# Patient Record
Sex: Male | Born: 1955 | Race: White | Hispanic: No | Marital: Married | Smoking: Current every day smoker
Health system: Southern US, Community
[De-identification: ages and names within clinical notes are randomized; demographics above are authoritative.]

## PROBLEM LIST (undated history)

## (undated) DIAGNOSIS — G35 Multiple sclerosis: Secondary | ICD-10-CM

## (undated) DIAGNOSIS — J449 Chronic obstructive pulmonary disease, unspecified: Secondary | ICD-10-CM

## (undated) DIAGNOSIS — G1221 Amyotrophic lateral sclerosis: Secondary | ICD-10-CM

## (undated) DIAGNOSIS — E785 Hyperlipidemia, unspecified: Secondary | ICD-10-CM

## (undated) DIAGNOSIS — Z720 Tobacco use: Secondary | ICD-10-CM

## (undated) DIAGNOSIS — R1013 Epigastric pain: Secondary | ICD-10-CM

## (undated) DIAGNOSIS — M5412 Radiculopathy, cervical region: Secondary | ICD-10-CM

## (undated) HISTORY — PX: CHOLECYSTECTOMY: SHX55

## (undated) HISTORY — DX: Hyperlipidemia, unspecified: E78.5

## (undated) HISTORY — DX: Epigastric pain: R10.13

## (undated) HISTORY — DX: Radiculopathy, cervical region: M54.12

## (undated) HISTORY — DX: Tobacco use: Z72.0

## (undated) HISTORY — DX: Multiple sclerosis: G35

---

## 2004-10-06 ENCOUNTER — Ambulatory Visit: Payer: Self-pay | Admitting: Internal Medicine

## 2005-03-30 ENCOUNTER — Ambulatory Visit: Payer: Self-pay | Admitting: Unknown Physician Specialty

## 2005-04-15 ENCOUNTER — Ambulatory Visit: Payer: Self-pay | Admitting: Unknown Physician Specialty

## 2006-01-01 ENCOUNTER — Other Ambulatory Visit: Payer: Self-pay

## 2006-01-01 ENCOUNTER — Emergency Department: Payer: Self-pay | Admitting: Emergency Medicine

## 2006-02-24 ENCOUNTER — Ambulatory Visit: Payer: Self-pay | Admitting: Internal Medicine

## 2006-11-03 ENCOUNTER — Other Ambulatory Visit: Payer: Self-pay

## 2006-11-04 ENCOUNTER — Inpatient Hospital Stay: Payer: Self-pay | Admitting: Internal Medicine

## 2007-08-04 ENCOUNTER — Ambulatory Visit: Payer: Self-pay | Admitting: Internal Medicine

## 2007-11-02 ENCOUNTER — Ambulatory Visit: Payer: Self-pay | Admitting: Pain Medicine

## 2008-07-12 ENCOUNTER — Emergency Department: Payer: Self-pay | Admitting: Internal Medicine

## 2008-08-21 ENCOUNTER — Emergency Department: Payer: Self-pay | Admitting: Emergency Medicine

## 2010-02-26 ENCOUNTER — Ambulatory Visit: Payer: Self-pay | Admitting: Family Medicine

## 2010-08-18 ENCOUNTER — Emergency Department: Payer: Self-pay | Admitting: Emergency Medicine

## 2012-07-30 ENCOUNTER — Emergency Department: Payer: Self-pay | Admitting: Emergency Medicine

## 2012-08-04 ENCOUNTER — Encounter: Payer: Self-pay | Admitting: Nurse Practitioner

## 2012-08-04 ENCOUNTER — Encounter: Payer: Self-pay | Admitting: Cardiothoracic Surgery

## 2012-08-18 ENCOUNTER — Ambulatory Visit: Payer: Self-pay | Admitting: Vascular Surgery

## 2012-09-08 ENCOUNTER — Ambulatory Visit: Payer: Self-pay | Admitting: Cardiology

## 2013-07-27 ENCOUNTER — Ambulatory Visit: Payer: Medicare Other | Attending: Family Medicine | Admitting: Physical Therapy

## 2013-07-27 DIAGNOSIS — M6281 Muscle weakness (generalized): Secondary | ICD-10-CM | POA: Insufficient documentation

## 2013-07-27 DIAGNOSIS — IMO0001 Reserved for inherently not codable concepts without codable children: Secondary | ICD-10-CM | POA: Insufficient documentation

## 2013-07-27 DIAGNOSIS — G35 Multiple sclerosis: Secondary | ICD-10-CM | POA: Insufficient documentation

## 2014-06-01 ENCOUNTER — Emergency Department: Payer: Self-pay | Admitting: Emergency Medicine

## 2014-06-01 LAB — URINALYSIS, COMPLETE
BACTERIA: NONE SEEN
Bilirubin,UR: NEGATIVE
Blood: NEGATIVE
Glucose,UR: NEGATIVE mg/dL (ref 0–75)
Ketone: NEGATIVE
Leukocyte Esterase: NEGATIVE
Nitrite: NEGATIVE
Ph: 6 (ref 4.5–8.0)
Protein: NEGATIVE
SQUAMOUS EPITHELIAL: NONE SEEN
Specific Gravity: 1.006 (ref 1.003–1.030)
WBC UR: NONE SEEN /HPF (ref 0–5)

## 2014-06-01 LAB — COMPREHENSIVE METABOLIC PANEL
ALBUMIN: 4.1 g/dL (ref 3.4–5.0)
ANION GAP: 0 — AB (ref 7–16)
AST: 22 U/L (ref 15–37)
Alkaline Phosphatase: 180 U/L — ABNORMAL HIGH
BILIRUBIN TOTAL: 0.4 mg/dL (ref 0.2–1.0)
BUN: 8 mg/dL (ref 7–18)
CALCIUM: 8.9 mg/dL (ref 8.5–10.1)
Chloride: 106 mmol/L (ref 98–107)
Co2: 25 mmol/L (ref 21–32)
Creatinine: 0.86 mg/dL (ref 0.60–1.30)
EGFR (Non-African Amer.): >60
Glucose: 96 mg/dL (ref 65–99)
Osmolality: 261 (ref 275–301)
POTASSIUM: 3.7 mmol/L (ref 3.5–5.1)
SGPT (ALT): 30 U/L (ref 12–78)
Sodium: 131 mmol/L — ABNORMAL LOW (ref 136–145)
Total Protein: 7.7 g/dL (ref 6.4–8.2)

## 2014-06-01 LAB — CBC
HCT: 50.7 % (ref 40.0–52.0)
HGB: 17.1 g/dL (ref 13.0–18.0)
MCH: 31.7 pg (ref 26.0–34.0)
MCHC: 33.8 g/dL (ref 32.0–36.0)
MCV: 94 fL (ref 80–100)
Platelet: 233 10*3/uL (ref 150–440)
RBC: 5.41 10*6/uL (ref 4.40–5.90)
RDW: 13.3 % (ref 11.5–14.5)
WBC: 17.7 10*3/uL — ABNORMAL HIGH (ref 3.8–10.6)

## 2014-07-21 ENCOUNTER — Emergency Department: Payer: Self-pay | Admitting: Emergency Medicine

## 2014-07-21 LAB — URINALYSIS, COMPLETE
Bacteria: NONE SEEN
Bilirubin,UR: NEGATIVE
Blood: NEGATIVE
GLUCOSE, UR: NEGATIVE mg/dL (ref 0–75)
KETONE: NEGATIVE
LEUKOCYTE ESTERASE: NEGATIVE
Nitrite: NEGATIVE
PROTEIN: NEGATIVE
Ph: 6 (ref 4.5–8.0)
RBC, UR: NONE SEEN /HPF (ref 0–5)
SPECIFIC GRAVITY: 1.011 (ref 1.003–1.030)
Squamous Epithelial: NONE SEEN
WBC UR: <1 /HPF (ref 0–5)

## 2014-07-21 LAB — CBC
HCT: 52.4 % — ABNORMAL HIGH (ref 40.0–52.0)
HGB: 17.4 g/dL (ref 13.0–18.0)
MCH: 31.3 pg (ref 26.0–34.0)
MCHC: 33.2 g/dL (ref 32.0–36.0)
MCV: 94 fL (ref 80–100)
Platelet: 286 10*3/uL (ref 150–440)
RBC: 5.56 10*6/uL (ref 4.40–5.90)
RDW: 13.6 % (ref 11.5–14.5)
WBC: 13.6 10*3/uL — ABNORMAL HIGH (ref 3.8–10.6)

## 2014-07-21 LAB — BASIC METABOLIC PANEL
Anion Gap: 9 (ref 7–16)
BUN: 8 mg/dL (ref 7–18)
CALCIUM: 8.9 mg/dL (ref 8.5–10.1)
CHLORIDE: 103 mmol/L (ref 98–107)
CREATININE: 0.87 mg/dL (ref 0.60–1.30)
Co2: 25 mmol/L (ref 21–32)
Glucose: 86 mg/dL (ref 65–99)
OSMOLALITY: 271 (ref 275–301)
Potassium: 4 mmol/L (ref 3.5–5.1)
Sodium: 137 mmol/L (ref 136–145)

## 2014-11-07 ENCOUNTER — Emergency Department: Payer: Self-pay | Admitting: Emergency Medicine

## 2014-11-07 LAB — URINALYSIS, COMPLETE
BACTERIA: NONE SEEN
BILIRUBIN, UR: NEGATIVE
BLOOD: NEGATIVE
Glucose,UR: NEGATIVE mg/dL (ref 0–75)
KETONE: NEGATIVE
Leukocyte Esterase: NEGATIVE
NITRITE: NEGATIVE
Ph: 6 (ref 4.5–8.0)
Protein: NEGATIVE
RBC,UR: 1 /HPF (ref 0–5)
SPECIFIC GRAVITY: 1.009 (ref 1.003–1.030)
SQUAMOUS EPITHELIAL: NONE SEEN
WBC UR: <1 /HPF (ref 0–5)

## 2015-12-09 ENCOUNTER — Encounter: Payer: Self-pay | Admitting: Emergency Medicine

## 2015-12-09 ENCOUNTER — Emergency Department
Admission: EM | Admit: 2015-12-09 | Discharge: 2015-12-09 | Disposition: A | Discharge status: Home / Self Care | Payer: Medicare Other | Attending: Emergency Medicine | Admitting: Emergency Medicine

## 2015-12-09 DIAGNOSIS — R1030 Lower abdominal pain, unspecified: Secondary | ICD-10-CM | POA: Diagnosis not present

## 2015-12-09 DIAGNOSIS — Z880 Allergy status to penicillin: Secondary | ICD-10-CM | POA: Insufficient documentation

## 2015-12-09 DIAGNOSIS — F17200 Nicotine dependence, unspecified, uncomplicated: Secondary | ICD-10-CM | POA: Insufficient documentation

## 2015-12-09 DIAGNOSIS — R339 Retention of urine, unspecified: Secondary | ICD-10-CM | POA: Insufficient documentation

## 2015-12-09 HISTORY — DX: Chronic obstructive pulmonary disease, unspecified: J44.9

## 2015-12-09 HISTORY — DX: Amyotrophic lateral sclerosis: G12.21

## 2015-12-09 LAB — URINALYSIS COMPLETE WITH MICROSCOPIC (ARMC ONLY)
BILIRUBIN URINE: NEGATIVE
Bacteria, UA: NONE SEEN
GLUCOSE, UA: NEGATIVE mg/dL
KETONES UR: NEGATIVE mg/dL
Leukocytes, UA: NEGATIVE
NITRITE: NEGATIVE
Protein, ur: 30 mg/dL — AB
SPECIFIC GRAVITY, URINE: 1.01 (ref 1.005–1.030)
Squamous Epithelial / LPF: NONE SEEN
pH: 6 (ref 5.0–8.0)

## 2015-12-09 MED ORDER — LIDOCAINE HCL 2 % EX GEL
1.0000 "application " | Freq: Once | CUTANEOUS | Status: AC
  Administered 2015-12-09: 1 via URETHRAL

## 2015-12-09 MED ORDER — LIDOCAINE HCL 2 % EX GEL
CUTANEOUS | Status: AC
  Administered 2015-12-09: 1 via URETHRAL
  Filled 2015-12-09: qty 10

## 2015-12-09 NOTE — ED Notes (Signed)
Pt voices relief s/p cath. Clear yellow urine. Needs to cath 1-2 times year.

## 2015-12-09 NOTE — ED Notes (Signed)
Pt with 1000cc urine output from indwelling foley

## 2015-12-09 NOTE — ED Provider Notes (Signed)
[facility] Emergency Department Provider Note  ____________________________________________    I have reviewed the triage vital signs and the nursing notes.   HISTORY  Chief Complaint Urinary Retention    HPI Shaun Turner is a 60 y.o. male with a history of AMS who self caths routinely but today was unable to get urine out and his home nurse adjusted that maybe he has urinary tract infection. He reports last and that he urinated was approximately 2-3 AM this morning. He denies fevers or chills. He does have abdominal fullness and discomfort in his lower abdomen     Past Medical History  Diagnosis Date  . 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 COPD (chronic obstructive pulmonary disease) (HCC)     There are no active problems to display for this patient.   Past Surgical History  Procedure Laterality Date  . Cholecystectomy      No current outpatient prescriptions on file.  Allergies Penicillins  No family history on file.  Social History Social History  Substance Use Topics  . Smoking status: Current Every Day Smoker  . Smokeless tobacco: None  . Alcohol Use: No    Review of Systems  Constitutional: Negative for fever. Eyes: Negative for visual changes. ENT: Negative for sore throat Cardiovascular: Negative for chest pain. Respiratory: Negative for shortness of breath. Gastrointestinal: Abdominal discomfort Genitourinary: Self catheter, urinary retention Musculoskeletal: Negative for back pain. Skin: Negative for rash. Neurological: Negative for headaches Psychiatric:*No anxiety    ____________________________________________   PHYSICAL EXAM:  VITAL SIGNS: ED Triage Vitals  Enc Vitals Group     BP 12/09/15 1313 119/107 mmHg     Pulse Rate 12/09/15 1313 100     Resp 12/09/15 1313 20     Temp 12/09/15 1313 98.2 F (36.8 C)     Temp Source 12/09/15 1313 Oral     SpO2 12/09/15 1313 95 %      Weight 12/09/15 1313 158 lb (71.668 kg)     Height 12/09/15 1313 5\' 11"  (1.803 m)     Head Cir --      Peak Flow --      Pain Score 12/09/15 1314 10     Pain Loc --      Pain Edu? --      Excl. in GC? --      Constitutional: Alert and oriented. Well appearing and in no distress. Eyes: Conjunctivae are normal.  ENT   Head: Normocephalic and atraumatic.   Mouth/Throat: Mucous membranes are moist. Cardiovascular: Normal rate, regular rhythm. Normal and symmetric distal pulses are present in all extremities. Respiratory: Normal respiratory effort without tachypnea nor retractions. Breath sounds are clear and equal bilaterally.  Gastrointestinal: Soft and non-tender in all quadrants. No distention. There is no CVA tenderness. Exam performed after Foley was placed by RN Genitourinary: No erythema or swelling . Neurologic:  Normal speech and language.  Skin:  Skin is warm, dry and intact. No rash noted. Psychiatric: Mood and affect are normal. Patient exhibits appropriate insight and judgment.  ____________________________________________    LABS (pertinent positives/negatives)  Labs Reviewed  URINALYSIS COMPLETEWITH MICROSCOPIC ([facility] ONLY)    ____________________________________________   EKG  None  ____________________________________________    RADIOLOGY I have personally reviewed any xrays that were ordered on this patient: None  ____________________________________________   PROCEDURES  Procedure(s) performed: none  Critical Care performed: none  ____________________________________________   INITIAL IMPRESSION / ASSESSMENT AND PLAN / ED COURSE  Pertinent labs &  imaging results that were available during my care of the patient were reviewed by me and considered in my medical decision making (see chart for details).  Experienced significant relief after Foley placement by RN. We will send urinalysis but otherwise no further workup  needed   Urinalysis unremarkable. Patient will follow-up with his PCP or his urologist for removal, whomever he can see first ____________________________________________   FINAL CLINICAL IMPRESSION(S) / ED DIAGNOSES  Final diagnoses:  Urinary retention     Jene Every, MD 12/09/15 1426

## 2015-12-09 NOTE — ED Notes (Signed)
Self-caths and unable to get urine out since around 1:00am.  Painful in abd.  History of MS.  Concerned has a UTI

## 2015-12-09 NOTE — Discharge Instructions (Signed)
Acute Urinary Retention, Male °Acute urinary retention is the temporary inability to urinate. °This is a common problem in older men. As men age their prostates become larger and block the flow of urine from the bladder. This is usually a problem that has come on gradually.  °HOME CARE INSTRUCTIONS °If you are sent home with a Foley catheter and a drainage system, you will need to discuss the best course of action with your health care provider. While the catheter is in, maintain a good intake of fluids. Keep the drainage bag emptied and lower than your catheter. This is so that contaminated urine will not flow back into your bladder, which could lead to a urinary tract infection. °There are two main types of drainage bags. One is a large bag that usually is used at night. It has a good capacity that will allow you to sleep through the night without having to empty it. The second type is called a leg bag. It has a smaller capacity, so it needs to be emptied more frequently. However, the main advantage is that it can be attached by a leg strap and can go underneath your clothing, allowing you the freedom to move about or leave your home. °Only take over-the-counter or prescription medicines for pain, discomfort, or fever as directed by your health care provider.  °SEEK MEDICAL CARE IF: °· You develop a low-grade fever. °· You experience spasms or leakage of urine with the spasms. °SEEK IMMEDIATE MEDICAL CARE IF:  °· You develop chills or fever. °· Your catheter stops draining urine. °· Your catheter falls out. °· You start to develop increased bleeding that does not respond to rest and increased fluid intake. °MAKE SURE YOU: °· Understand these instructions. °· Will watch your condition. °· Will get help right away if you are not doing well or get worse. °  °This information is not intended to replace advice given to you by your health care provider. Make sure you discuss any questions you have with your health care  provider. °  °Document Released: 02/08/2001 Document Revised: 03/19/2015 Document Reviewed: 04/13/2013 °Elsevier Interactive Patient Education ©2016 [employer] ° °

## 2015-12-19 ENCOUNTER — Encounter: Payer: Self-pay | Admitting: Emergency Medicine

## 2015-12-19 ENCOUNTER — Emergency Department
Admission: EM | Admit: 2015-12-19 | Discharge: 2015-12-19 | Disposition: A | Discharge status: Home / Self Care | Payer: Medicare Other | Attending: Emergency Medicine | Admitting: Emergency Medicine

## 2015-12-19 DIAGNOSIS — Z880 Allergy status to penicillin: Secondary | ICD-10-CM | POA: Diagnosis not present

## 2015-12-19 DIAGNOSIS — R34 Anuria and oliguria: Secondary | ICD-10-CM | POA: Diagnosis not present

## 2015-12-19 DIAGNOSIS — F17200 Nicotine dependence, unspecified, uncomplicated: Secondary | ICD-10-CM | POA: Insufficient documentation

## 2015-12-19 DIAGNOSIS — Z466 Encounter for fitting and adjustment of urinary device: Secondary | ICD-10-CM | POA: Diagnosis not present

## 2015-12-19 DIAGNOSIS — R339 Retention of urine, unspecified: Secondary | ICD-10-CM | POA: Diagnosis not present

## 2015-12-19 LAB — URINALYSIS COMPLETE WITH MICROSCOPIC (ARMC ONLY)
Bilirubin Urine: NEGATIVE
GLUCOSE, UA: NEGATIVE mg/dL
Hgb urine dipstick: NEGATIVE
KETONES UR: NEGATIVE mg/dL
LEUKOCYTES UA: NEGATIVE
NITRITE: NEGATIVE
Protein, ur: NEGATIVE mg/dL
Specific Gravity, Urine: 1.01 (ref 1.005–1.030)
Squamous Epithelial / LPF: NONE SEEN
pH: 6 (ref 5.0–8.0)

## 2015-12-19 NOTE — ED Notes (Signed)
Bladder scanned, >999 in bladder.

## 2015-12-19 NOTE — ED Notes (Addendum)
Pt reports being unable to urinate since last night.  Pt w/ 46F catheter placed via triage.  Pt states pain and discomfort prior to being catheterized, states relief at this time.  Pt reports same problem a few weeks ago where he was seen and catheterized but states he was not given a urologist to follow up with; reports removing catheter himself.

## 2015-12-19 NOTE — Care Management Note (Signed)
Case Management Note  Patient Details  Name: KEMAL AMORES MRN: [id number] Date of Birth: 1956/09/05  Subjective/Objective:     Call from [place] [facility] earlier today, said the patient pulled out his catheter he had, and that he needs a urology consult. [facility] RN called them to let us know he should be coming.               Action/Plan:   Expected Discharge Date:                  Expected Discharge Plan:     In-House Referral:     Discharge planning Services     Post Acute Care Choice:    Choice offered to:     DME Arranged:    DME Agency:     HH Arranged:    HH Agency:     Status of Service:     Medicare Important Message Given:    Date Medicare IM Given:    Medicare IM give by:    Date Additional Medicare IM Given:    Additional Medicare Important Message give by:     If discussed at Long Length of Stay Meetings, dates discussed:    Additional Comments:  Berna Bue, RN 12/19/2015, 1:34 PM

## 2015-12-19 NOTE — ED Provider Notes (Signed)
[facility] Emergency Department Provider Note  ____________________________________________  Time seen: Approximately 2:55 PM  I have reviewed the triage vital signs and the nursing notes.   HISTORY  Chief Complaint Urinary Retention   HPI DELVONTE BERENSON is a 60 y.o. male, NAD, presents to the emergency department with complaints of abdominal fullness and distention. Notes he has been unable to urinate since last night.  Has history of MS and has the symptoms occasionally due to indwelling catheterization. Normally changes his catheter every 3-5 days but will occasionally have episodes where he needs replacement within a medical facility. Denies fever, chills, fatigue, body aches. Denies any generalized abdominal pain, dysuria, hematuria. No odor to the urine.   Past Medical History  Diagnosis Date  . 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 COPD (chronic obstructive pulmonary disease) (HCC)     There are no active problems to display for this patient.   Past Surgical History  Procedure Laterality Date  . Cholecystectomy      No current outpatient prescriptions on file.  Allergies Penicillins  No family history on file.  Social History Social History  Substance Use Topics  . Smoking status: Current Every Day Smoker  . Smokeless tobacco: None  . Alcohol Use: No     Review of Systems  Constitutional: No fever/chills, fatigue.  Cardiovascular: No chest pain. Respiratory: No cough. No shortness of breath. No wheezing.  Gastrointestinal: Abdominal fullness and distention. No abdominal pain, nausea, vomiting.  No diarrhea.  No constipation. Genitourinary: Anuria since last pm. Negative for dysuria. No hematuria.  Musculoskeletal: Negative for back pain.  Skin: Negative for rash. Neurological: Negative for headaches, focal weakness or numbness. 10-point ROS otherwise  negative.  ____________________________________________   PHYSICAL EXAM:  VITAL SIGNS: ED Triage Vitals  Enc Vitals Group     BP 12/19/15 1336 163/90 mmHg     Pulse Rate 12/19/15 1336 110     Resp 12/19/15 1336 20     Temp 12/19/15 1336 97.7 F (36.5 C)     Temp Source 12/19/15 1336 Oral     SpO2 12/19/15 1336 95 %     Weight 12/19/15 1329 150 lb (68.04 kg)     Height 12/19/15 1329 5\' 7"  (1.702 m)     Head Cir --      Peak Flow --      Pain Score 12/19/15 1329 10     Pain Loc --      Pain Edu? --      Excl. in GC? --     Constitutional: Alert and oriented. Well appearing and in no acute distress. In a wheelchair Eyes: Conjunctivae are normal. PERRL.  Head: Atraumatic. Neck: Supple with FROM Hematological/Lymphatic/Immunilogical: No cervical lymphadenopathy. Cardiovascular: Normal rate, regular rhythm. Normal S1 and S2.  Good peripheral circulation. Respiratory: Normal respiratory effort without tachypnea or retractions. Lungs CTAB. Gastrointestinal: Soft and nontender. No distention. No guarding. Exam performed after Foley was placed by nursing staff. Neurologic:  Normal speech and language. No gross focal neurologic deficits are appreciated.  Skin:  Skin is warm, dry and intact. No rash noted. Psychiatric: Mood and affect are normal. Speech and behavior are normal. Patient exhibits appropriate insight and judgement.   ____________________________________________   LABS (all labs ordered are listed, but only abnormal results are displayed)  Labs Reviewed  URINALYSIS COMPLETEWITH MICROSCOPIC ([facility] ONLY) - Abnormal; Notable for the following:    Color, Urine YELLOW (*)    APPearance CLEAR (*)  Bacteria, UA RARE (*)    All other components within normal limits   ____________________________________________  EKG  None ____________________________________________  RADIOLOGY  None ____________________________________________    PROCEDURES  Procedure(s)  performed: None    Medications - No data to display   ____________________________________________   INITIAL IMPRESSION / ASSESSMENT AND PLAN / ED COURSE  Pertinent labs & imaging results that were available during my care of the patient were reviewed by me and considered in my medical decision making (see chart for details).  Patient experienced significant relief of symptoms after Foley was placed by nursing staff. UA noted rare bacteria but otherwise was unremarkable.  Patient will be following up with Urology as referred by his PCP.   Patient is given ED precautions to return to the ED for any worsening or new symptoms.    ____________________________________________  FINAL CLINICAL IMPRESSION(S) / ED DIAGNOSES  Final diagnoses:  Urinary retention  Encounter for Foley catheter replacement      NEW MEDICATIONS STARTED DURING THIS VISIT:  New Prescriptions   No medications on file         Hope Pigeon, PA-C 12/19/15 1518  Phineas Semen, MD 12/19/15 1620

## 2015-12-19 NOTE — Discharge Instructions (Signed)
Clean Intermittent Catheterization, Male Clean intermittent catheterization (CIC) refers to emptying urine from the bladder with a small, flexible tube (catheter). The bladder is an organ in the body that stores urine. Urine may need to be drained from your bladder with a catheter if:  There is an obstruction to the flow of urine out of the bladder or through the urinary tract.  The bladder muscles or nerves are not functioning properly to allow normal flow of urine out of the bladder. Emptying the bladder regularly will help prevent further permanent bladder or kidney damage. SUPPLIES FOR CIC You will need:   The specific type and size of catheter as directed by your caregiver.  Water-soluble, lubricating jelly (if the catheter is not pre-lubricated). Do not use an oil-based lubricant.  A warm, soapy washcloth or pre-moistened wipes.  A container to collect the urine (if you are not using the toilet).  A container or bag to store the catheter. HOW TO PERFORM CIC Follow these steps for a clean technique: 1. Collect supplies and place them within your reach. 2. Wash your hands thoroughly with soap and water. 3. Get in a comfortable position. Positions include:  Sitting on a toilet, wheelchair, chair, or edge of bed.  Standing near a toilet.  Lying down with your head raised on pillows. 4. Position the collection container between your legs (if you are not using the toilet). 5. Urinate (if you are able). 6. Place water-soluble, lubricating jelly on about 2 inches (5 cm) of the tip of the catheter (if the catheter is not pre-lubricated). 7. Set the catheter down on a clean, dry surface within reach. 8. Hold the penis and gently stretch it out from your body. Pull back any skin covering the end of the penis (foreskin) and wash thoroughly. Wash the penis with the warm, soapy washcloth or pre-moistened wipes. Start by washing at the tip and continue washing toward the base of the  penis. 9. Hold the penis upward at a 45 to 60angle. This helps to straighten the urethra. 10. Relax. 11. Insert the catheter gently into the penis. If you meet resistance during insertion, stop and adjust the position of the penis to a 90 angle. Insert the catheter until urine starts to flow, usually 6 to 8 inches (15 to 20 cm). When urine starts to flow, insert the catheter about 1 inch (3 cm) more. 12. When the urine stops flowing, strain or gently push on the lower abdominal muscles to help the bladder drain fully. 13. Gently pull the catheter out in a downward motion. 14. Pull the foreskin back down over the tip of the penis. 15. Wash the penis. 16. Report any changes in the urine to your caregiver. 17. Discard the urine. 18. If you are using a multiple use catheter, wash the catheter as directed by your caregiver. Rinse. Allow to air dry. Store the catheter in a clean, dry container or bag. 19. Wash your hands. HOME CARE INSTRUCTIONS   Drink 6 to 8 glasses of fluid each day. Avoid caffeine. Caffeine may make you have to urinate more frequently and more urgently.  Empty your bladder every 4 to 6 hours or as directed by your caregiver.  Perform a CIC if you have symptoms of too much urine in your bladder (overdistension) and you are not able to urinate. Symptoms of overdistension include:  Restlessness.  Sweating or chills.  Headache.  Flushed or pale color.  Cold limbs.  Bloated lower abdomen.  Discard a   multiple use catheter when it becomes dry, brittle, or cloudy (usually after 1 week of use).  Take all medications as directed by your caregiver. SEEK MEDICAL CARE IF:  You are having trouble performing any of the steps.  You are leaking urine.  You have pain when you urinate.  You notice blood in your urine.  You feel the need to empty your bladder (void) often.  Your urine is cloudy or smells different.  You have pain in your abdomen.  You develop a rash  or sores. SEEK IMMEDIATE MEDICAL CARE IF:  You have a fever or persistent symptoms for more than 72 hours.  You have a fever and your symptoms suddenly get worse.  Your pain becomes severe.   This information is not intended to replace advice given to you by your health care provider. Make sure you discuss any questions you have with your health care provider.   Document Released: 12/05/2010 Document Revised: 11/23/2014 Document Reviewed: 05/17/2015 Elsevier Interactive Patient Education 2016 [employer] Inc.  

## 2015-12-19 NOTE — ED Notes (Signed)
C/o urinary retention since 2100 last night. Pt has ms, states this happens occasionally and he gets urinary catheter.

## 2016-06-02 ENCOUNTER — Ambulatory Visit: Payer: Medicare Other | Attending: Family Medicine | Admitting: Physical Therapy

## 2016-06-02 ENCOUNTER — Encounter: Payer: Self-pay | Admitting: Physical Therapy

## 2016-06-02 DIAGNOSIS — M6281 Muscle weakness (generalized): Secondary | ICD-10-CM | POA: Insufficient documentation

## 2016-06-02 NOTE — Therapy (Addendum)
[place] [facility] SERVICES [address] [place], [place], [address] Phone: [phone]   Fax:  [phone]  Physical Therapy Evaluation  Patient Details  Name: Shaun Turner MRN: [id number] Date of Birth: 60-05-1956 No Data Recorded  Encounter Date: 06/02/2016    Past Medical History  Diagnosis Date  . Amyotrophic lateral sclerosis/progressive muscular atrophy ([place])   . COPD (chronic obstructive pulmonary disease) [facility])     Past Surgical History  Procedure Laterality Date  . Cholecystectomy      There were no vitals filed for this visit.       Subjective Assessment - 06/02/16 1426    Subjective Patients powerchair is broken in many places and cannot be repaired.    Patient is accompained by: Family member   Pertinent History galbladder removed, COPD, 1986 MS, in [place]   How long can you stand comfortably? unable   How long can you walk comfortably? unable   Patient Stated Goals Patient uses a slide board to get in and out of the bed.    Currently in Pain? Yes   Pain Score 7    Pain Location Back  feet, legs, low back   Pain Descriptors / Indicators Aching      PATIENT INFORMATION: Name: Shaun Turner DOB: [birth year]                     Sex:m Date seen: 06/02/16       Time: 2:30  [address], [place] [address] Physician: Salome Holmes A This evaluation/justification form will serve as the LMN for the following suppliers: __________________________ Supplier: Contact Person:  Phone: Almeta Monas   Seating Therapist: Phone: [phone]   Phone: [phone]    Spouse/Parent/Caregiver name: Gardiner Sleeper  Phone number:  Insurance/Payer:  Medicare/medicaid/ united health care   Reason for Referral:Patients power chair is broken and in disrepair  Patient/Caregiver Goals: Get a power wheel chair  Patient was seen for face-to-face evaluation for new power wheelchair.  Also present  was  Almeta Monas                     to discuss recommendations and wheelchair options.  Further paperwork was completed and sent to vendor.  Patient appears to qualify for power mobility device at this time per objective findings.   MEDICAL HISTORY: Diagnosis:                           Multiple Sclerosis / ALS        Primary Diagnosis:  Onset: 1986 may Diagnosis: Multiple sclerosis/ ALS   [x] Progressive Disease Relevant past and future surgeries:  glabladder surgery  Height: 5 foot 11 inches Weight: 155 Explain recent changes or trends in weight:    History including Falls: no    HOME ENVIRONMENT: [] House  [] Condo/town home  [] Apartment  [] Assisted Living  Mobile home  [] Lives Alone [x]  Lives with Others                                                    Hours with caregiver:   [x] Home is accessible to patient           Stairs      [] Yes [x]  No  Ramp [x] Yes [] No Comments:     COMMUNITY ADL: TRANSPORTATION: [] Car    [x] Van    [] Public Transportation    [] Adapted w/c Lift    [] Ambulance    [] Other:       [x] Sits in wheelchair during transport  Employment/School:     Specific requirements pertaining to mobility                                                     Other: Patient needs the power chair for mobility in his home and to go to the doctor.                                    FUNCTIONAL/SENSORY PROCESSING SKILLS:  Handedness:   [] Right     [] Left    [] NA  Comments:                                 Functional Processing Skills for Wheeled Mobility [x] Processing Skills are adequate for safe wheelchair operation  Areas of concern than may interfere with safe operation of wheelchair Description of problem   []  Attention to environment      [] Judgment      []  Hearing  []  Vision or visual processing      [] Motor Planning  []  Fluctuations in Behavior                                                   VERBAL COMMUNICATION: [x] WFL receptive [x]  WFL expressive [x] Understandable   [] Difficult to understand  [] non-communicative []  Uses an augmented communication device  CURRENT SEATING / MOBILITY: Current Mobility Base:  [] None [] Dependent [] Manual [] Scooter [x] Power  Type of Control:                       Manufacturer: invocare [id number]                        Size:     18x18                    Age:   7                        Current Condition of Mobility Base:  In disrepair                                                                                                                   Current Wheelchair components:  Tilt/recline/power legs  Describe posture in present seating system:     Spine is corved                                                                       SENSATION and SKIN ISSUES: Sensation [] Intact  [x] Impaired [] Absent  Level of sensation:   Impaired light and deep BLE                       Pressure Relief: Able to perform effective pressure relief :    [] Yes  [x]  No Method:    Unable to use his left Ue                                                                          If not, Why?:                                                                          Skin Issues/Skin Integrity Current Skin Issues  [x] Yes [] No [] Intact [x]  Red area[]  Open Area  [] Scar Tissue [] At risk from prolonged sitting Where     Left buttocks                         History of Skin Issues  [] Yes [x] No Where                                         When                                               Hx of skin flap surgeries  [] Yes [x] No Where                                              When                                                  Limited sitting tolerance [x] Yes [] No Hours spent sitting in wheelchair daily:      15 hours  Complaint of Pain:  Please describe: Pain in low back and BLE  down to his feet                                                                                                            Swelling/Edema:    none                                                                                                                                         ADL STATUS (in reference to wheelchair use):  Indep Assist Unable Indep with Equip Not assessed Comments  Dressing                 x                                                        Eating                 x                                                                                                             Toileting       x  Bathing               x                                                                                                                       Grooming/ Hygiene                x                                                                                                              Meal Prep                x                                                                                                          IADLS                 s                                                                                                 Bowel Management: [x] Continent  [] Incontinent  [] Accidents Comments:                                                  Bladder Management: [x] Continent  [] Incontinent  [] Accidents Comments:  WHEELCHAIR SKILLS: Manual w/c Propulsion: [] UE or LE strength and endurance sufficient to participate in ADLs using manual wheelchair Arm : [] left [] right   [] Both      Distance:                                      Foot:  [] left [] right   [] Both  Operate Scooter: []  Strength, hand grip, balance and transfer appropriate for use [] Living environment is accessible for use of scooter  Operate Power w/c:  []  Std.  Joystick   []  Alternative Controls Indep []  Assist []  Dependent/ unable []  N/A []   [x] Safe          []  Functional      Distance:               Bed confined without wheelchair [x]  Yes []  No   STRENGTH/RANGE OF MOTION:  Range of Motion Strength  Shoulder          LUE shoulder 90 deg                                           -3/5 LUE, RUE is 3/5                                                       Elbow     BUE         WFL                                           3/5     BUE                                                  Wrist/Hand     L UE hand         Unable to extend fingers/thumb                                                        0/5        LUE, RUE hand is 4/5                                                           Hip  unable to test hip extension  2/5 BLE                                                           Knee   - 30 deg extension bilaterally                                                    2/5 BLE                                                                Ankle WFL    0/5 BLE                                                                 MOBILITY/BALANCE:  []  Patient is totally dependent for mobility                                                                                               Balance Transfers Ambulation  Sitting Balance: Standing Balance: []  Independent []  Independent/Modified Independent  []  WFL     []  WFL []  Supervision []  Supervision  [x]  Uses UE for balance  []  Supervision []  Min Assist []  Ambulates with Assist                           []  Min Assist []  Min assist []  Mod Assist []  Ambulates with Device:      []  RW  []  StW  []  Cane  []                 []  Mod Assist []  Mod assist []  Max assist   []  Max Assist []  Max assist []  Dependent []  Indep. Short Distance Only  []  Unable [x]  Unable []  Lift / Sling Required Distance (in feet)                             [x]  Sliding board [x]  Unable to  Ambulate: (Explain:  Cardio Status:  [x] Intact  []  Impaired   []  NA                              Respiratory Status:  [] Intact   [x] Impaired   [] NA  Orthotics/Prosthetics:                                                                         Comments (Address manual vs power w/c vs scooter):    Patient is not able to operate a manual wc due to limited strength and ROM in LUE shoulder , wrist and hand. He has trunk curve and posterior pelvis and poor seated balance that will not allow patient to operate a scooter.                                              Anterior / Posterior Obliquity Rotation-Pelvis                               PELVIS    []  [x]  []   Neutral Posterior Anterior  []  [x]  []   WFL Rt elev Lt elev  []  [x]  []   WFL Right Left             Anterior Anterior     []  Fixed []  Other [x]  Partly Flexible []  Flexible   []  Fixed []  Other [x]  Partly Flexible  []  Flexible  []  Fixed []  Other [x]  Partly Flexible  []  Flexible   TRUNK  []  []  []   WFL  Thoracic  Lumbar  Kyphosis Lordosis  []  []  [x]   WFL Convex Convex  Right Left [] c-curve [] s-curve                [] multiple  []  Neutral [x]  Left-anterior []  Right-anterior     []  Fixed [x]  Flexible []  Partly Flexible  Other  []  Fixed [x]  Flexible []  Partly Flexible[]  Other  []  Fixed            [x]  Flexible []  Partly Flexible []  Other    Position Windswept                   HIPS          []            [x]               []  Neutral       Abduct        ADduct         []           []            [x]   Neutral Right           Left      []  Fixed []  Subluxed [x]  Partly Flexible                []  Dislocated []  Flexible  []  Fixed []  Other [x]  Partly Flexible  []  Flexible                 Foot Positioning Knee Positioning                              []  WFL  [] Lt [] Rt []  WFL  [] Lt [] Rt  KNEES ROM concerns: ROM concerns: lacking 30 deg extension BLE    & Dorsi-Flexed [] Lt [] Rt                                    FEET Plantar Flexed [x] Lt [x] Rt      Inversion                 [] Lt [] Rt      Eversion                 [] Lt [] Rt     HEAD [x]  Functional [x]  Good Head Control                     & []  Flexed         []  Extended []  Adequate Head Control    NECK []  Rotated  Lt  []  Lat Flexed Lt []  Rotated  Rt []  Lat Flexed Rt []  Limited Head Control     []  Cervical Hyperextension []  Absent  Head Control     SHOULDERS ELBOWS WRIST& HAND                                Left     Right    Left     Right    Left     Right   U/E [] Functional           [x] Functional right                                 [x] Fisting             [] Fisting      [] elev   [] dep      [] elev   [] dep       [] pro -[] retract     [] pro  [] retract [] subluxed             [] subluxed          Goals for Wheelchair Mobility  [x]  Independence with mobility in the home with motor related ADLs (MRADLs)  [x]  Independence with MRADLs in the community [x]  Provide dependent mobility  [x]  Provide recline     [x] Provide tilt   Goals for Seating system [x]  Optimize pressure distribution [x]  Provide support needed to facilitate function or safety [x]  Provide corrective forces to assist with maintaining or improving posture [x]  Accommodate client's posture:   current seated postures and positions are not flexible or will not tolerate corrective forces [x]  Client to be independent with relieving pressure in the wheelchair [x] Enhance physiological function such as breathing, swallowing, digestion  Simulation ideas/Equipment trials:                                                                                                State why other equipment was unsuccessful:  MOBILITY BASE RECOMMENDATIONS and JUSTIFICATION: MOBILITY COMPONENT JUSTIFICATION  Manufacturer:           Model:              Size: Width           Seat Depth             [x] provide transport from point  A to B [x] promote Indep mobility  [x] is not a safe, functional ambulator [x] walker or cane inadequate [] non-standard width/depth necessary to accommodate anatomical measurement []                             [] Manual Mobility Base [] non-functional ambulator    [] Scooter/POV  [] can safely operate  [] can safely transfer   [] has adequate trunk stability  [] cannot functionally propel manual w/c  [x] Power Mobility Base  [x] non-ambulatory  [x] cannot functionally propel manual wheelchair  [x]  cannot functionally and safely operate scooter/POV [] can safely operate and willing to  [] Stroller Base [] infant/child  [] unable to propel manual wheelchair [] allows for growth [] non-functional ambulator [] non-functional UE [] Indep mobility is not a goal at this time  [x] Tilt  [] Forward [x] Backward [x] Powered tilt  [] Manual tilt  [x] change position against gravitational force on head and shoulders  [x] change position for pressure relief/cannot weight shift [x] transfers  [x] management of tone [x] rest periods [x] control edema [x] facilitate postural control  []                                       [x] Recline  [x] Power recline on power base [] Manual recline on manual base  [x] accommodate femur to back angle  [x] bring to full recline for ADL care  [x] change position for pressure relief/cannot weight shift [x] rest periods [x] repositioning for transfers or clothing/diaper /catheter changes [x] head positioning  [] Lighter weight required [] self- propulsion  [] lifting []                                                 [] Heavy Duty required [] user weight greater than 250# [] extreme tone/ over active movement [] broken frame on previous chair []                                     []  Back  []  Angle Adjustable []  Custom molded                           [] postural control [] control of tone/spasticity [] accommodation of range of motion [] UE functional control [] accommodation for seating system []                                           [] provide lateral trunk support [] accommodate deformity [] provide posterior trunk support [] provide lumbar/sacral support [] support trunk in midline [] Pressure relief over spinal processes  [x]  Seat Cushion                       [x] impaired sensation  [] decubitus ulcers present [x] history of pressure ulceration [] prevent pelvic extension [x] low maintenance  [x] stabilize pelvis  [x] accommodate obliquity [x] accommodate multiple deformity [x] neutralize lower  extremity position [x] increase pressure distribution []                                           []  Pelvic/thigh support  []  Lateral thigh guide []  Distal medial pad  []  Distal lateral pad []  pelvis in neutral [] accommodate pelvis []  position upper legs []  alignment []  accommodate ROM []  decrease adduction [] accommodate tone [] removable for transfers [] decrease abduction  []  Lateral trunk Supports [x]  Lt     [x]  Rt [x] decrease lateral trunk leaning [x] control tone [x] contour for increased contact [x] safety  [x] accommodate asymmetry []                                                 [x]  Mounting hardware  [x] lateral trunk supports  [] back   [] seat [x] headrest      []  thigh support [] fixed   [x] swing away [x] attach seat platform/cushion to w/c frame [] attach back cushion to w/c frame [x] mount postural supports [x] mount headrest  [] swing medial thigh support away [] swing lateral supports away for transfers  []                                                     Armrests  [] fixed [x] adjustable height [] removable   [] swing away  [x] flip back   [x] reclining [x] full length pads [] desk    [] pads tubular  [x] provide support with elbow at 90   [] provide support for w/c tray [x] change of height/angles for variable activities [x] remove for transfers [x] allow to come closer to table top [x] remove for access to tables []                                               Hangers/ Leg rests  [] 60 [] 70 [] 90  [x] elevating [] heavy duty  [x] articulating [] fixed [] lift off [] swing away     [x] power [x] provide LE support  [x] accommodate to hamstring tightness [x] elevate legs during recline   [x] provide change in position for Legs [x] Maintain placement of feet on footplate [x] durability [] enable transfers [x] decrease edema [x] Accommodate lower leg length []                                         Foot support Footplate    [] Lt  []  Rt  [x]  Center mount [] flip up     [] depth/angle adjustable [] Amputee adapter    []  Lt     []  Rt [x] provide foot support [x] accommodate to ankle ROM [x] transfers [x] Provide support for residual extremity []  allow foot to go under wheelchair base [x]  decrease tone  []                                                 []  Ankle strap/heel loops [] support foot on foot support [] decrease extraneous movement [] provide input to heel  []   protect foot  Tires: [x] pneumatic  [x] flat free inserts  [] solid  [x] decrease maintenance  [x] prevent frequent flats [x] increase shock absorbency [x] decrease pain from road shock [x] decrease spasms from road shock []                                              [x]  Headrest  [x] provide posterior head support [x] provide posterior neck support [x] provide lateral head support [x] provide anterior head support [x] support during tilt and recline [] improve feeding   [] improve respiration [] placement of switches [] safety  [] accommodate ROM  [] accommodate tone [] improve visual orientation  []  Anterior chest strap []  Vest []  Shoulder retractors  [] decrease forward movement of shoulder [] accommodation of TLSO [] decrease forward movement of trunk [] decrease shoulder elevation [] added abdominal support [] alignment [] assistance with shoulder control  []                                               Pelvic Positioner [x] Belt [] SubASIS bar [] Dual Pull [x] stabilize tone [x] decrease falling out of chair/ **will not Decrease potential for  sliding due to pelvic tilting [x] prevent excessive rotation [] pad for protection over boney prominence [] prominence comfort [] special pull angle to control rotation []                                                  Upper ExtremitySupport [] L   []  R [] Arm trough    [] hand support []  tray       [] full tray [] swivel mount [] decrease edema      [] decrease subluxation   [] control tone   [] placement for AAC/Computer/EADL [] decrease gravitational pull on shoulders [] provide midline positioning [] provide support to increase UE function [] provide hand support in natural position [] provide work surface   POWER WHEELCHAIR CONTROLS  [x] Proportional  [] Non-Proportional Type                                      [] Left  [] Right [x] provides access for controlling wheelchair   [] lacks motor control to operate proportional drive control [] unable to understand proportional controls  Actuator Control Module  [] Single  [x] Multiple   [x] Allow the client to operate the power seat function(s) through the joystick control   [] Safety Reset Switches [] Used to change modes and stop the wheelchair when driving in latch mode    [x] Upgraded Electronics   [x] programming for accurate control [x] progressive Disease/changing condition [x] non-proportional drive control needed [x] Needed in order to operate power seat functions through joystick control   [] Display box [] Allows user to see in which mode and drive the wheelchair is set  [] necessary for alternate controls    [] Digital interface electronics [] Allows w/c to operate when using alternative drive controls  [] ASL Head Array [] Allows client to operate wheelchair  through switches placed in tri-panel headrest  [] Sip and puff with tubing kit [] needed to operate sip and puff drive controls  [] Upgraded tracking electronics [] increase safety when driving [] correct tracking when on uneven surfaces  [] Mount for switches or joystick [] Attaches switches to w/c   [] Swing away for access or transfers [] midline  for optimal placement [] provides for consistent access  [] Attendant controlled joystick plus mount [] safety [] long distance driving [] operation of seat functions [] compliance with transportation regulations []                                             Rear wheel placement/Axle adjustability [] None [] semi adjustable [] fully adjustable  [] improved UE access to wheels [] improved stability [] changing angle in space for improvement of postural stability [] 1-arm drive access [] amputee pad placement []                                Wheel rims/ hand rims  [] metal  [] plastic coated [] oblique projections [] vertical projections [] Provide ability to propel manual wheelchair  []  Increase self-propulsion with hand weakness/decreased grasp  Push handles [] extended  [] angle adjustable  [] standard [] caregiver access [] caregiver assist [] allows "hooking" to enable increased ability to perform ADLs or maintain balance  One armed device  [] Lt   [] Rt [] enable propulsion of manual wheelchair with one arm   []                                            Brake/wheel lock extension []  Lt   []  Rt [] increase indep in applying wheel locks   [] Side guards [] prevent clothing getting caught in wheel or becoming soiled []  prevent skin tears/abrasions  Battery:   yes                                         [x] to power wheelchair                                                         Other:                                                                                                                        The above equipment has a life- long use expectancy. Growth and changes in medical and/or functional conditions would be the exceptions. This is to certify that the therapist has no financial relationship with durable medical provider or manufacturer. The therapist will not receive remuneration of any kind for the equipment recommended in this evaluation.   Patient has  mobility limitation that significantly impairs safe, timely participation in one or more mobility related ADL's.  (bathing, toileting, feeding, dressing, grooming, moving from room to room)                                                             [  x] Yes []  No Will mobility device sufficiently improve ability to participate and/or be aided in participation of MRADL's?      [x]  Yes []  No Can limitation be compensated for with use of a cane or walker?                                                                                []  Yes [x]  No Does patient or caregiver demonstrate ability/potential ability & willingness to safely use the mobility device?    [x]  Yes []  No Does patient's home environment support use of recommended mobility device?                                                    [x]  Yes []  No Does patient have sufficient upper extremity function necessary to functionally propel a manual wheelchair?     []  Yes [x]  No Does patient have sufficient strength and trunk stability to safely operate a POV (scooter)?                                  []  Yes [x]  No Does patient need additional features/benefits provided by a power wheelchair for MRADL's in the home?        [x]  Yes []  No Does the patient demonstrate the ability to safely use a power wheelchair?                                                              [x]  Yes []  No    Physician's Name Printed:   Salome Holmes A                                                     Physician's Signature:  Date:     This is to certify that I, the above signed therapist have the following affiliations: [x]  This DME provider [x]  Manufacturer of recommended equipment [x]  Patient's long term care facility [x]  None of the above  Therapist Name/Signature:    Arelia Sneddon PT, DPT                                        Date:06/02/16                                 PT Long Term Goals - 06/02/16 1513    PT  LONG TERM GOAL #1   Title Pt and caregivers will understand PT recommendation and  appropriate/safe use for wheelchair and seating for home use.   Time 1   Period Days   Status New               Plan - 06/02/16 1512    Clinical Impression Statement Patient  has MS and his power wc is in disrepair. He is here to be evaluated for a new wc .    PT Frequency One time visit   Consulted and Agree with Plan of Care Patient      Patient will benefit from skilled therapeutic intervention in order to improve the following deficits and impairments:  Decreased balance, Pain, Postural dysfunction, Decreased mobility, Decreased range of motion, Decreased strength  Visit Diagnosis: Muscle weakness (generalized)     Problem List There are no active problems to display for this patient. Alanson Puls, PT, DPT  [place], [place] S 06/02/2016, 3:14 PM  [place] MAIN [facility] SERVICES [address] [place], [place], [address] Phone: [phone]   Fax:  [phone]  Name: DEJUAN ELMAN MRN: [id number] Date of Birth: 1956/02/08

## 2016-06-02 NOTE — Therapy (Deleted)
[place] [facility] MAIN [facility] SERVICES [address] [place], [place], [address] Phone: [phone]   Fax:  [phone]  Patient Details  Name: SHAIN PAUWELS MRN: [id number] Date of Birth: May 27, 1956 Referring Provider:  Rayetta Humphrey, MD  Encounter Date: 06/02/2016 Ezekiel Ina, PT, DPT  [place] B[place] S 06/02/2016, 2:31 PM  [place] [facility] MAIN [facility] SERVICES [address] [place], [place], [address] Phone: [phone]   Fax:  [phone]

## 2016-06-15 ENCOUNTER — Encounter (INDEPENDENT_AMBULATORY_CARE_PROVIDER_SITE_OTHER): Payer: Self-pay

## 2016-07-03 ENCOUNTER — Ambulatory Visit: Payer: Self-pay | Admitting: Unknown Physician Specialty

## 2016-07-03 ENCOUNTER — Encounter: Payer: Self-pay | Admitting: Family Medicine

## 2016-07-03 ENCOUNTER — Ambulatory Visit (INDEPENDENT_AMBULATORY_CARE_PROVIDER_SITE_OTHER): Payer: Medicare Other | Admitting: Family Medicine

## 2016-07-03 VITALS — BP 114/67 | HR 99 | Temp 97.8°F

## 2016-07-03 DIAGNOSIS — F17200 Nicotine dependence, unspecified, uncomplicated: Secondary | ICD-10-CM

## 2016-07-03 DIAGNOSIS — J449 Chronic obstructive pulmonary disease, unspecified: Secondary | ICD-10-CM | POA: Diagnosis not present

## 2016-07-03 DIAGNOSIS — G1221 Amyotrophic lateral sclerosis: Secondary | ICD-10-CM

## 2016-07-03 DIAGNOSIS — G35 Multiple sclerosis: Secondary | ICD-10-CM | POA: Insufficient documentation

## 2016-07-03 DIAGNOSIS — K219 Gastro-esophageal reflux disease without esophagitis: Secondary | ICD-10-CM | POA: Insufficient documentation

## 2016-07-03 DIAGNOSIS — Z7689 Persons encountering health services in other specified circumstances: Secondary | ICD-10-CM

## 2016-07-03 DIAGNOSIS — Z716 Tobacco abuse counseling: Secondary | ICD-10-CM | POA: Diagnosis not present

## 2016-07-03 DIAGNOSIS — E785 Hyperlipidemia, unspecified: Secondary | ICD-10-CM | POA: Insufficient documentation

## 2016-07-03 DIAGNOSIS — Z7189 Other specified counseling: Secondary | ICD-10-CM | POA: Diagnosis not present

## 2016-07-03 MED ORDER — NICOTINE 21 MG/24HR TD PT24: 21.0000 mg | MEDICATED_PATCH | Freq: Every day | TRANSDERMAL | 6 refills | Status: DC

## 2016-07-03 MED ORDER — PREDNISONE 10 MG PO TABS: 10.0000 mg | ORAL_TABLET | Freq: Every day | ORAL | 0 refills | Status: DC

## 2016-07-03 NOTE — Progress Notes (Signed)
BP 114/67   Pulse 99   Temp 97.8 F (36.6 C)   SpO2 95%    Subjective:    Patient ID: Shaun Turner, male    DOB: June 17, 1956, 60 y.o.   MRN: [id number]  HPI: Shaun Turner is a 60 y.o. male  Chief Complaint  Patient presents with  . Establish Care    just need to establish. He does have alot of pain and wants something for that.   Patient presents today to Establish Care. Has multiple chronic issues, including ALS and MS. C/o pain in legs and feet chronically. Has not seen Neurology in quite some time. Has seen Pain Management but they would not give him chronic pain medicine. Only on Tramadol right now and it is not adequately controlling his pain per patient. Prednisone typically helps calm everything down when it's flared like this.   Also interested in smoking cessation. Has quit 4-5 times in the past and has had good success with the patch.   Relevant past medical, surgical, family and social history reviewed and updated as indicated. Interim medical history since our last visit reviewed. Allergies and medications reviewed and updated.  Review of Systems  Respiratory: Negative.   Cardiovascular: Negative.   Gastrointestinal: Negative.   Musculoskeletal: Positive for arthralgias and myalgias.  Neurological: Positive for weakness.  Psychiatric/Behavioral: Negative.     Per HPI unless specifically indicated above     Objective:    BP 114/67   Pulse 99   Temp 97.8 F (36.6 C)   SpO2 95%   Wt Readings from Last 3 Encounters:  12/19/15 150 lb (68 kg)  12/09/15 158 lb (71.7 kg)    Physical Exam  Constitutional: He is oriented to person, place, and time.  Wheelchair bound, appears slightly underweight  HENT:  Head: Atraumatic.  Eyes: Conjunctivae are normal. No scleral icterus.  Neck: Normal range of motion. Neck supple.  Cardiovascular: Normal heart sounds.   Pulmonary/Chest: Effort normal.  Decreased breath sounds b/l, diffuse mild wheezes  Neurological: He  is alert and oriented to person, place, and time.  Skin: Skin is warm and dry.  Psychiatric: He has a normal mood and affect. His behavior is normal.  Nursing note and vitals reviewed.       Assessment & Plan:   Problem List Items Addressed This Visit      Respiratory   COPD (chronic obstructive pulmonary disease) (HCC)   Relevant Medications   Albuterol Sulfate 108 (90 Base) MCG/ACT AEPB   fluticasone-salmeterol (ADVAIR HFA) 230-21 MCG/ACT inhaler   ipratropium (ATROVENT HFA) 17 MCG/ACT inhaler   nicotine (NICODERM CQ - DOSED IN MG/24 HOURS) 21 mg/24hr patch   predniSONE (DELTASONE) 10 MG tablet     Digestive   GERD (gastroesophageal reflux disease)   Relevant Medications   omeprazole (PRILOSEC OTC) 20 MG tablet     Nervous and Auditory   MS (multiple sclerosis) (HCC)   Relevant Orders   Ambulatory referral to Neurology   ALS (amyotrophic lateral sclerosis) (HCC)     Other   Hyperlipidemia   Relevant Medications   lovastatin (MEVACOR) 40 MG tablet   Tobacco use disorder   Relevant Medications   nicotine (NICODERM CQ - DOSED IN MG/24 HOURS) 21 mg/24hr patch    Other Visit Diagnoses    Encounter to establish care    -  Primary   Tobacco abuse counseling        Counseling given regarding smoking cessation. He will use  patches and set quit date. Referral placed for Neurology. Discussed that we don't manage chronic pain at this office and that I would be happy to place a referral, he will look into options there and let us know. Prednisone taper given for the meantime. Doing well with all other medications. Will schedule a physical today at check-out.    Follow up plan: Return for Physical Exam.

## 2016-07-03 NOTE — Patient Instructions (Signed)
Follow up as needed  Schedule Annual Physical at earliest convenience

## 2016-07-21 ENCOUNTER — Other Ambulatory Visit: Payer: Self-pay | Admitting: Unknown Physician Specialty

## 2016-08-14 ENCOUNTER — Other Ambulatory Visit: Payer: Self-pay | Admitting: Unknown Physician Specialty

## 2016-08-28 ENCOUNTER — Other Ambulatory Visit: Payer: Self-pay | Admitting: Unknown Physician Specialty

## 2016-08-28 ENCOUNTER — Other Ambulatory Visit: Payer: Self-pay

## 2016-08-28 MED ORDER — LOVASTATIN 40 MG PO TABS: 60.0000 mg | ORAL_TABLET | Freq: Every day | ORAL | 0 refills | Status: DC

## 2016-08-28 NOTE — Telephone Encounter (Signed)
Your patient.  Thanks 

## 2016-08-28 NOTE — Telephone Encounter (Signed)
Pt would like a refill on lovastatin (MEVACOR) 40 MG tablet sent to [facility]

## 2016-09-01 ENCOUNTER — Encounter: Payer: Self-pay | Admitting: Unknown Physician Specialty

## 2016-09-01 ENCOUNTER — Ambulatory Visit (INDEPENDENT_AMBULATORY_CARE_PROVIDER_SITE_OTHER): Payer: Medicare Other | Admitting: Unknown Physician Specialty

## 2016-09-01 VITALS — BP 145/75 | HR 79 | Temp 97.6°F

## 2016-09-01 DIAGNOSIS — I739 Peripheral vascular disease, unspecified: Secondary | ICD-10-CM | POA: Diagnosis not present

## 2016-09-01 DIAGNOSIS — M79605 Pain in left leg: Secondary | ICD-10-CM

## 2016-09-01 NOTE — Assessment & Plan Note (Signed)
Pt with left foot pain that I suspect is related to circulation.  Check Uric Acid and CMP to r/o gout.

## 2016-09-01 NOTE — Progress Notes (Signed)
BP (!) 145/75 (BP Location: Right Arm, Cuff Size: Normal)   Pulse 79   Temp 97.6 F (36.4 C)   SpO2 98%    Subjective:    Patient ID: Shaun Turner, male    DOB: [birth year], 60 y.o.   MRN: [id number]  HPI: ULA YINGER is a 60 y.o. male  Chief Complaint  Patient presents with  . Foot Pain    pt states his left foot has been bothering him for about 3 months    This is a pt with chronic medical medical  issues who is wheelchair bound with MS since 1997. He is new to [place] as [facility] no accepts his insurance.   He also has COPD and a 1ppd smoker.   He is here complaining of left foot pain Thinking he got it infected after spraining it.  The lateral aspect is painful.  No real ulcers.    Smoking - did not take patches as insurance wouldn't pay for it.      Relevant past medical, surgical, family and social history reviewed and updated as indicated. Interim medical history since our last visit reviewed. Allergies and medications reviewed and updated.  Review of Systems  Per HPI unless specifically indicated above     Objective:    BP (!) 145/75 (BP Location: Right Arm, Cuff Size: Normal)   Pulse 79   Temp 97.6 F (36.4 C)   SpO2 98%   Wt Readings from Last 3 Encounters:  12/19/15 150 lb (68 kg)  12/09/15 158 lb (71.7 kg)    Physical Exam  Constitutional: He is oriented to person, place, and time. He appears well-developed and well-nourished. He is cooperative. No distress.  HENT:  Head: Normocephalic and atraumatic.  Eyes: Conjunctivae and lids are normal. Right eye exhibits no discharge. Left eye exhibits no discharge. No scleral icterus.  Cardiovascular: Normal rate.   Pulmonary/Chest: Effort normal.  Abdominal: Normal appearance. There is no splenomegaly or hepatomegaly.  Musculoskeletal: Normal range of motion.  In a wheelchair.  Both feet cold to touch and left foot in particular.  No ulcers.  Generalized erythema but cold to touch.  No pulses could be detected  with poor capillary refill  Neurological: He is alert and oriented to person, place, and time.  Skin: Skin is intact. No rash noted. No pallor.  Psychiatric: He has a normal mood and affect. His behavior is normal. Judgment and thought content normal.    Results for orders placed or performed during the hospital encounter of 12/19/15  Urinalysis complete, with microscopic- may I&O cath if menses [facility] only)  Result Value Ref Range   Color, Urine YELLOW (A) YELLOW   APPearance CLEAR (A) CLEAR   Glucose, UA NEGATIVE NEGATIVE mg/dL   Bilirubin Urine NEGATIVE NEGATIVE   Ketones, ur NEGATIVE NEGATIVE mg/dL   Specific Gravity, Urine 1.010 1.005 - 1.030   Hgb urine dipstick NEGATIVE NEGATIVE   pH 6.0 5.0 - 8.0   Protein, ur NEGATIVE NEGATIVE mg/dL   Nitrite NEGATIVE NEGATIVE   Leukocytes, UA NEGATIVE NEGATIVE   RBC / HPF 0-5 0 - 5 RBC/hpf   WBC, UA 0-5 0 - 5 WBC/hpf   Bacteria, UA RARE (A) NONE SEEN   Squamous Epithelial / LPF NONE SEEN NONE SEEN   Mucous PRESENT       Assessment & Plan:   Problem List Items Addressed This Visit      Unprioritized   PVD (peripheral vascular disease) with claudication (  HCC)    Pt with left foot pain that I suspect is related to circulation.  Check Uric Acid and CMP to r/o gout.        Relevant Orders   Ambulatory referral to Vascular Surgery   Uric acid   Comprehensive metabolic panel    Other Visit Diagnoses    Pain of left lower extremity    -  Primary   Relevant Orders   Uric acid   Comprehensive metabolic panel       Follow up plan: Return for physical.

## 2016-09-02 LAB — COMPREHENSIVE METABOLIC PANEL
ALT: 15 IU/L (ref 0–44)
AST: 16 IU/L (ref 0–40)
Albumin/Globulin Ratio: 1.9 (ref 1.2–2.2)
Albumin: 4.5 g/dL (ref 3.6–4.8)
Alkaline Phosphatase: 206 IU/L — ABNORMAL HIGH (ref 39–117)
BILIRUBIN TOTAL: 0.3 mg/dL (ref 0.0–1.2)
BUN/Creatinine Ratio: 12 (ref 10–24)
BUN: 8 mg/dL (ref 8–27)
CALCIUM: 9.5 mg/dL (ref 8.6–10.2)
CHLORIDE: 96 mmol/L (ref 96–106)
CO2: 28 mmol/L (ref 18–29)
Creatinine, Ser: 0.69 mg/dL — ABNORMAL LOW (ref 0.76–1.27)
GFR calc non Af Amer: 103 mL/min/{1.73_m2} (ref >59)
GFR, EST AFRICAN AMERICAN: 119 mL/min/{1.73_m2} (ref >59)
GLUCOSE: 77 mg/dL (ref 65–99)
Globulin, Total: 2.4 g/dL (ref 1.5–4.5)
Potassium: 4.5 mmol/L (ref 3.5–5.2)
Sodium: 140 mmol/L (ref 134–144)
TOTAL PROTEIN: 6.9 g/dL (ref 6.0–8.5)

## 2016-09-02 LAB — URIC ACID: URIC ACID: 4.4 mg/dL (ref 3.7–8.6)

## 2016-09-07 ENCOUNTER — Encounter (INDEPENDENT_AMBULATORY_CARE_PROVIDER_SITE_OTHER): Payer: Self-pay | Admitting: Vascular Surgery

## 2016-09-07 ENCOUNTER — Ambulatory Visit (INDEPENDENT_AMBULATORY_CARE_PROVIDER_SITE_OTHER): Payer: Medicare Other | Admitting: Vascular Surgery

## 2016-09-07 ENCOUNTER — Encounter (INDEPENDENT_AMBULATORY_CARE_PROVIDER_SITE_OTHER): Payer: Self-pay

## 2016-09-07 ENCOUNTER — Encounter: Payer: Self-pay | Admitting: Unknown Physician Specialty

## 2016-09-07 VITALS — BP 135/72 | HR 79 | Resp 16 | Ht 70.0 in | Wt 160.0 lb

## 2016-09-07 DIAGNOSIS — J449 Chronic obstructive pulmonary disease, unspecified: Secondary | ICD-10-CM

## 2016-09-07 DIAGNOSIS — G35 Multiple sclerosis: Secondary | ICD-10-CM | POA: Diagnosis not present

## 2016-09-07 DIAGNOSIS — F17200 Nicotine dependence, unspecified, uncomplicated: Secondary | ICD-10-CM

## 2016-09-07 DIAGNOSIS — M79604 Pain in right leg: Secondary | ICD-10-CM

## 2016-09-07 DIAGNOSIS — M79605 Pain in left leg: Secondary | ICD-10-CM | POA: Diagnosis not present

## 2016-09-07 DIAGNOSIS — F17210 Nicotine dependence, cigarettes, uncomplicated: Secondary | ICD-10-CM

## 2016-09-07 DIAGNOSIS — I70222 Atherosclerosis of native arteries of extremities with rest pain, left leg: Secondary | ICD-10-CM | POA: Diagnosis not present

## 2016-09-07 NOTE — Progress Notes (Signed)
[facility] Admission History & Physical  MRN : [id number]  Shaun Turner is a 60 y.o. (01-28-1956) male who presents with chief complaint of  Chief Complaint  Patient presents with  . New Patient (Initial Visit)  .  History of Present Illness:  The patient is seen for evaluation of painful lower extremities and diminished pulses. Patient notes the pain it  is always present worse at night when he is trying to sleep.  He notes is associated with activity and is very consistent day today. The pain has been progressive over the past several months.  He has multiple sclerosis and is wheelchair bound. The patient states the leg pain is now having a profound negative impact on quality of life and daily activities.  No open wounds or sores at this time. No prior interventions or surgeries.  No history of back problems or DJD of the lumbar sacral spine.   The patient denies changes in claudication symptoms or new rest pain symptoms.  No new ulcers or wounds of the foot.  The patient's blood pressure has been stable and relatively well controlled. The patient denies amaurosis fugax or recent TIA symptoms. There are no recent neurological changes noted. The patient denies history of DVT, PE or superficial thrombophlebitis. The patient denies recent episodes of angina or shortness of breath.    No outpatient prescriptions have been marked as taking for the 09/07/16 encounter (Office Visit) with Renford DillsGregory G Delyle Weider, MD.    Past Medical History:  Diagnosis Date  . 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 Cervical radiculopathy   . COPD (chronic obstructive pulmonary disease) (HCC)   . Dyspepsia   . Hyperlipidemia   . MS (multiple sclerosis) (HCC)   . Tobacco abuse     Past Surgical History:  Procedure Laterality Date  . CHOLECYSTECTOMY      Social History Social History  Substance Use Topics  . Smoking status: Current Every Day  Smoker    Packs/day: 1.00    Years: 40.00    Types: Cigarettes  . Smokeless tobacco: Former NeurosurgeonUser  . Alcohol use No    Family History Family History  Problem Relation Age of Onset  . Drug abuse Mother   . Arrhythmia Mother   . Hypertension Mother   . Hyperlipidemia Mother   . Cancer Father     lung?  . Cancer Sister     breast  . Diabetes Brother   . COPD Neg Hx   . Heart disease Neg Hx   . Stroke Neg Hx     Allergies  Allergen Reactions  . Penicillins Rash     REVIEW OF SYSTEMS (Negative unless checked)  Constitutional: [] Weight loss  [] Fever  [] Chills Cardiac: [] Chest pain   [] Chest pressure   [] Palpitations   [x] Shortness of breath when laying flat   [] Shortness of breath with exertion. Vascular:  [] Pain in legs with walking   [x] Pain in legs at rest  [] History of DVT   [] Phlebitis   [] Swelling in legs   [] Varicose veins   [] Non-healing ulcers Pulmonary:   [] Uses home oxygen   [] Productive cough   [] Hemoptysis   [] Wheeze  [x] COPD   [] Asthma Neurologic:  [] Dizziness   [] Seizures   [] History of stroke   [] History of TIA  [] Aphasia   [] Vissual changes   [x] Weakness or numbness in arm   [x] Weakness or numbness in leg Musculoskeletal:   [] Joint swelling   [x] Joint pain   [x] Low  back pain Hematologic:  [] Easy bruising  [] Easy bleeding   [] Hypercoagulable state   [] Anemic Gastrointestinal:  [] Diarrhea   [] Vomiting  [] Gastroesophageal reflux/heartburn   [] Difficulty swallowing. Genitourinary:  [] Chronic kidney disease   [x] Difficult urination  [] Frequent urination   [] Blood in urine Skin:  [] Rashes   [] Ulcers  Psychological:  [] History of anxiety   []  History of major depression.  Physical Examination  Vitals:   09/07/16 1118  BP: 135/72  Pulse: 79  Resp: 16  Weight: 160 lb (72.6 kg)  Height: 5\' 10"  (1.778 m)   Body mass index is 22.96 kg/m. Gen: WD, debilitated in a wheelchair, moderate distress Head: [place]/AT, moderate temporalis wasting.  Ear/Nose/Throat: Hearing  grossly intact, nares w/o erythema or drainage, poor dentition Eyes: PER, EOMI, sclera nonicteric.  Neck: Supple, no masses.  No bruit or JVD.  Pulmonary:  Good air movement, clear to auscultation bilaterally, no use of accessory muscles.  Cardiac: RRR, normal S1, S2, no Murmurs. Vascular: sluggish cap refill bilaterally with marked trophic changes no wounds Vessel Right Left  Radial Palpable Palpable  Ulnar Palpable Palpable  Brachial Palpable Palpable  Carotid Palpable Palpable  Femoral Not Palpable Not Palpable  Popliteal Not Palpable Not Palpable  PT Not Palpable Not Palpable  DP Not Palpable Not Palpable   Gastrointestinal: soft, non-distended. No guarding/no peritoneal signs.  Musculoskeletal: M/S 2/5 lower extremities with 4/5 upper extremities bilaterally.  Marked bilateral leg deformity with severe atrophy atrophy.  Neurologic: CN 2-12 intact. Pain and light touch intact in extremities.   Speech is fluent. Motor exam as listed above.  Patient is wheelchair bound Psychiatric: Judgment intact, Mood & affect appropriate for pt's clinical situation. Dermatologic: No rashes or ulcers noted.  No changes consistent with cellulitis. Lymph : No Cervical lymphadenopathy, no lichenification or skin changes of chronic lymphedema.  CBC Lab Results  Component Value Date   WBC 13.6 (H) 07/21/2014   HGB 17.4 07/21/2014   HCT 52.4 (H) 07/21/2014   MCV 94 07/21/2014   PLT 286 07/21/2014    BMET    Component Value Date/Time   NA 140 09/01/2016 1419   NA 137 07/21/2014 1648   K 4.5 09/01/2016 1419   K 4.0 07/21/2014 1648   CL 96 09/01/2016 1419   CL 103 07/21/2014 1648   CO2 28 09/01/2016 1419   CO2 25 07/21/2014 1648   GLUCOSE 77 09/01/2016 1419   GLUCOSE 86 07/21/2014 1648   BUN 8 09/01/2016 1419   BUN 8 07/21/2014 1648   CREATININE 0.69 (L) 09/01/2016 1419   CREATININE 0.87 07/21/2014 1648   CALCIUM 9.5 09/01/2016 1419   CALCIUM 8.9 07/21/2014 1648   GFRNONAA 103  09/01/2016 1419   GFRNONAA >60 07/21/2014 1648   GFRAA 119 09/01/2016 1419   GFRAA >60 07/21/2014 1648   Estimated Creatinine Clearance: 100.8 mL/min (by C-G formula based on SCr of 0.69 mg/dL (L)).  COAG No results found for: INR, PROTIME  Radiology No results found.  Assessment/Plan 1. Atherosclerosis of native artery of left lower extremity with rest pain (HCC) Recommend:  The patient has evidence of severe atherosclerotic changes of both lower extremities with rest pain that is associated with preulcerative changes and impending tissue loss of the foot.  This represents a limb threatening ischemia and places the patient at the risk for limb loss.  Patient should undergo arterial ultrasound STAT to better plan for angiogram and intervention.    He is having significant pain and is not sleeping well and  therefore it is appropriate that he be given a narcotic for pain control.  I have advised his that this is an acute situation and that given hsi MS he will likely always have pain and I will not be Rx pain medication indenfinitely.  He voices understanding.  Rx for Vicodin given  The patient will follow up with me in the office to review the noninvasive studies.  - VAS [place] ABI WITH/WO TBI; Future - VAS [place] LOWER EXTREMITY ARTERIAL DUPLEX; Future  2. Pain in both lower extremities See above pain secondary to ASO and MS - VAS [place] ABI WITH/WO TBI; Future - VAS [place] LOWER EXTREMITY ARTERIAL DUPLEX; Future  3. MS (multiple sclerosis) (HCC) Continue Tegretol, Neurontin and Zanaflex  medications with out interuption  4. Tobacco use disorder Smoking cessation strongly encouraged  5. Chronic obstructive pulmonary disease, unspecified COPD type (HCC) Continue Albuterol and Ipratropium    Levora Dredge, MD  09/07/2016 12:13 PM

## 2016-09-10 ENCOUNTER — Other Ambulatory Visit: Payer: Self-pay | Admitting: Unknown Physician Specialty

## 2016-09-15 ENCOUNTER — Ambulatory Visit (INDEPENDENT_AMBULATORY_CARE_PROVIDER_SITE_OTHER): Payer: Medicare Other | Admitting: Unknown Physician Specialty

## 2016-09-15 ENCOUNTER — Encounter: Payer: Self-pay | Admitting: Unknown Physician Specialty

## 2016-09-15 VITALS — BP 133/77 | HR 89 | Temp 97.8°F

## 2016-09-15 DIAGNOSIS — I739 Peripheral vascular disease, unspecified: Secondary | ICD-10-CM | POA: Diagnosis not present

## 2016-09-15 DIAGNOSIS — G35 Multiple sclerosis: Secondary | ICD-10-CM

## 2016-09-15 DIAGNOSIS — E7800 Pure hypercholesterolemia, unspecified: Secondary | ICD-10-CM | POA: Diagnosis not present

## 2016-09-15 DIAGNOSIS — M79605 Pain in left leg: Secondary | ICD-10-CM | POA: Diagnosis not present

## 2016-09-15 DIAGNOSIS — F17200 Nicotine dependence, unspecified, uncomplicated: Secondary | ICD-10-CM | POA: Diagnosis not present

## 2016-09-15 DIAGNOSIS — R5383 Other fatigue: Secondary | ICD-10-CM

## 2016-09-15 DIAGNOSIS — M79604 Pain in right leg: Secondary | ICD-10-CM | POA: Diagnosis not present

## 2016-09-15 DIAGNOSIS — Z0000 Encounter for general adult medical examination without abnormal findings: Secondary | ICD-10-CM | POA: Diagnosis not present

## 2016-09-15 DIAGNOSIS — Z5181 Encounter for therapeutic drug level monitoring: Secondary | ICD-10-CM | POA: Diagnosis not present

## 2016-09-15 NOTE — Assessment & Plan Note (Signed)
Per Dr. Ernestene KielSheiner

## 2016-09-15 NOTE — Assessment & Plan Note (Addendum)
Encouraged to quit.  Encouraged to set a quit date.  Low dose CT screening

## 2016-09-15 NOTE — Assessment & Plan Note (Signed)
Due right now probably to PVD.  Will f/u after treatment

## 2016-09-15 NOTE — Assessment & Plan Note (Addendum)
Was referred to neurology However no appointment set up.  Messaged the scheduler

## 2016-09-15 NOTE — Progress Notes (Signed)
BP 133/77 (BP Location: Left Arm, Patient Position: Sitting, Cuff Size: Normal)   Pulse 89   Temp 97.8 F (36.6 C)   SpO2 98%    Subjective:    Patient ID: Shaun Turner, male    DOB: 1956/09/01, 60 y.o.   MRN: [id number]  HPI: Shaun Turner is a 60 y.o. male  Chief Complaint  Patient presents with  . Medicare Wellness   Functional Status Survey: Is the patient deaf or have difficulty hearing?: No Does the patient have difficulty seeing, even when wearing glasses/contacts?: Yes (pt states he has trouble seeing up close) Does the patient have difficulty concentrating, remembering, or making decisions?: Yes Does the patient have difficulty walking or climbing stairs?: Yes Does the patient have difficulty dressing or bathing?: Yes Does the patient have difficulty doing errands alone such as visiting a doctor's office or shopping?: Yes  Fall Risk  09/15/2016  Falls in the past year? No   Depression screen PHQ 2/9 09/15/2016  Decreased Interest 0  Down, Depressed, Hopeless 0  PHQ - 2 Score 0   Mini cog is negative  COPD Smokes 1 ppd.  Has tried to quit and would like to quit.  Tried patches and Nic[place]te.  Feels symptoms are well controlled.  Takes inhalers as directed upon review but needs to bring them in.     Using medications without problems Night time symptoms: none ER visits since last visit: Missed work or school:: N/A Using O2:no Some SOB  MS Has not seen neurology for a while.  Last note I see is from 2014 and was seeing [facility].  A referral was put in 8/18 but I don't see any follow-up to that  PAD Is following up with vascular.  I have reviewed those notes.    GERD Controlled on Omeprazole  Chronic pain Due right now probably to PVD.  Will f/u after treatment   Relevant past medical, surgical, family and social history reviewed and updated as indicated. Interim medical history since our last visit reviewed. Allergies and medications reviewed and  updated.  Review of Systems  Per HPI unless specifically indicated above     Objective:    BP 133/77 (BP Location: Left Arm, Patient Position: Sitting, Cuff Size: Normal)   Pulse 89   Temp 97.8 F (36.6 C)   SpO2 98%   Wt Readings from Last 3 Encounters:  09/07/16 160 lb (72.6 kg)  12/19/15 150 lb (68 kg)  12/09/15 158 lb (71.7 kg)    Physical Exam  Constitutional: He is oriented to person, place, and time. He appears well-developed and well-nourished. No distress.  HENT:  Head: Normocephalic and atraumatic.  Eyes: Conjunctivae and lids are normal. Right eye exhibits no discharge. Left eye exhibits no discharge. No scleral icterus.  Neck: Normal range of motion. Neck supple. No JVD present. Carotid bruit is not present.  Cardiovascular: Normal rate, regular rhythm and normal heart sounds.   Pulmonary/Chest: Effort normal and breath sounds normal. No respiratory distress.  Abdominal: Normal appearance. There is no splenomegaly or hepatomegaly.  Musculoskeletal: Normal range of motion.  Neurological: He is alert and oriented to person, place, and time.  Skin: Skin is warm, dry and intact. No rash noted. No pallor.  Psychiatric: He has a normal mood and affect. His behavior is normal. Judgment and thought content normal.    Results for orders placed or performed in visit on 09/01/16  Uric acid  Result Value Ref Range   Uric  Acid 4.4 3.7 - 8.6 mg/dL  Comprehensive metabolic panel  Result Value Ref Range   Glucose 77 65 - 99 mg/dL   BUN 8 8 - 27 mg/dL   Creatinine, Ser 9.600.69 (L) 0.76 - 1.27 mg/dL   GFR calc non Af Amer 103 >59 mL/min/1.73   GFR calc Af Amer 119 >59 mL/min/1.73   BUN/Creatinine Ratio 12 10 - 24   Sodium 140 134 - 144 mmol/L   Potassium 4.5 3.5 - 5.2 mmol/L   Chloride 96 96 - 106 mmol/L   CO2 28 18 - 29 mmol/L   Calcium 9.5 8.6 - 10.2 mg/dL   Total Protein 6.9 6.0 - 8.5 g/dL   Albumin 4.5 3.6 - 4.8 g/dL   Globulin, Total 2.4 1.5 - 4.5 g/dL    Albumin/Globulin Ratio 1.9 1.2 - 2.2   Bilirubin Total 0.3 0.0 - 1.2 mg/dL   Alkaline Phosphatase 206 (H) 39 - 117 IU/L   AST 16 0 - 40 IU/L   ALT 15 0 - 44 IU/L      Assessment & Plan:   Problem List Items Addressed This Visit      Unprioritized   Hyperlipidemia    Check lipid panel      Relevant Orders   Lipid Panel w/o Chol/HDL Ratio   Leg pain, bilateral    Due right now probably to PVD.  Will f/u after treatment      MS (multiple sclerosis) (HCC)    Was referred to neurology However no appointment set up.  Messaged the scheduler      PVD (peripheral vascular disease) with claudication (HCC)    Per Dr. Ernestene KielSheiner      Tobacco use disorder    Encouraged to quit.  Encouraged to set a quit date.  Low dose CT screening       Other Visit Diagnoses    Annual physical exam    -  Primary   Fatigue, unspecified type       Relevant Orders   CBC with Differential/Platelet   TSH   Medication monitoring encounter       Relevant Orders   Comprehensive metabolic panel       Follow up plan: Return in about 6 months (around 03/15/2017).

## 2016-09-15 NOTE — Assessment & Plan Note (Signed)
Check lipid panel  

## 2016-09-16 ENCOUNTER — Telehealth: Payer: Self-pay | Admitting: *Deleted

## 2016-09-16 ENCOUNTER — Encounter: Payer: Self-pay | Admitting: Unknown Physician Specialty

## 2016-09-16 LAB — LIPID PANEL W/O CHOL/HDL RATIO
Cholesterol, Total: 182 mg/dL (ref 100–199)
HDL: 43 mg/dL (ref >39)
LDL Calculated: 78 mg/dL (ref 0–99)
Triglycerides: 307 mg/dL — ABNORMAL HIGH (ref 0–149)
VLDL Cholesterol Cal: 61 mg/dL — ABNORMAL HIGH (ref 5–40)

## 2016-09-16 LAB — CBC WITH DIFFERENTIAL/PLATELET
BASOS: 1 %
Basophils Absolute: 0.1 10*3/uL (ref 0.0–0.2)
EOS (ABSOLUTE): 0.2 10*3/uL (ref 0.0–0.4)
EOS: 2 %
HEMATOCRIT: 49.9 % (ref 37.5–51.0)
HEMOGLOBIN: 17.3 g/dL (ref 12.6–17.7)
Immature Grans (Abs): 0 10*3/uL (ref 0.0–0.1)
Immature Granulocytes: 0 %
LYMPHS ABS: 1.5 10*3/uL (ref 0.7–3.1)
Lymphs: 17 %
MCH: 32.2 pg (ref 26.6–33.0)
MCHC: 34.7 g/dL (ref 31.5–35.7)
MCV: 93 fL (ref 79–97)
MONOCYTES: 6 %
Monocytes Absolute: 0.5 10*3/uL (ref 0.1–0.9)
Neutrophils Absolute: 6.8 10*3/uL (ref 1.4–7.0)
Neutrophils: 74 %
Platelets: 231 10*3/uL (ref 150–379)
RBC: 5.37 x10E6/uL (ref 4.14–5.80)
RDW: 13.5 % (ref 12.3–15.4)
WBC: 9.1 10*3/uL (ref 3.4–10.8)

## 2016-09-16 LAB — COMPREHENSIVE METABOLIC PANEL
ALBUMIN: 4.5 g/dL (ref 3.6–4.8)
ALK PHOS: 198 IU/L — AB (ref 39–117)
ALT: 19 IU/L (ref 0–44)
AST: 15 IU/L (ref 0–40)
Albumin/Globulin Ratio: 2 (ref 1.2–2.2)
BUN / CREAT RATIO: 10 (ref 10–24)
BUN: 9 mg/dL (ref 8–27)
Bilirubin Total: 0.3 mg/dL (ref 0.0–1.2)
CO2: 28 mmol/L (ref 18–29)
CREATININE: 0.89 mg/dL (ref 0.76–1.27)
Calcium: 9.4 mg/dL (ref 8.6–10.2)
Chloride: 96 mmol/L (ref 96–106)
GFR, EST AFRICAN AMERICAN: 107 mL/min/{1.73_m2} (ref >59)
GFR, EST NON AFRICAN AMERICAN: 93 mL/min/{1.73_m2} (ref >59)
GLOBULIN, TOTAL: 2.3 g/dL (ref 1.5–4.5)
Glucose: 93 mg/dL (ref 65–99)
Potassium: 5.2 mmol/L (ref 3.5–5.2)
SODIUM: 137 mmol/L (ref 134–144)
TOTAL PROTEIN: 6.8 g/dL (ref 6.0–8.5)

## 2016-09-16 LAB — TSH: TSH: 0.554 u[IU]/mL (ref 0.450–4.500)

## 2016-09-16 NOTE — Telephone Encounter (Signed)
Received referral for low dose lung cancer screening CT scan. Voicemail left at phone number listed in EMR for patient to call me back to facilitate scheduling scan.  

## 2016-09-18 ENCOUNTER — Other Ambulatory Visit: Payer: Self-pay | Admitting: Unknown Physician Specialty

## 2016-10-05 ENCOUNTER — Ambulatory Visit (INDEPENDENT_AMBULATORY_CARE_PROVIDER_SITE_OTHER): Payer: Medicare Other

## 2016-10-05 ENCOUNTER — Encounter (INDEPENDENT_AMBULATORY_CARE_PROVIDER_SITE_OTHER): Payer: Self-pay | Admitting: Vascular Surgery

## 2016-10-05 ENCOUNTER — Ambulatory Visit (INDEPENDENT_AMBULATORY_CARE_PROVIDER_SITE_OTHER): Payer: Medicare Other | Admitting: Vascular Surgery

## 2016-10-05 VITALS — BP 128/72 | HR 86 | Resp 17 | Ht 71.0 in | Wt 158.0 lb

## 2016-10-05 DIAGNOSIS — M79604 Pain in right leg: Secondary | ICD-10-CM

## 2016-10-05 DIAGNOSIS — I70223 Atherosclerosis of native arteries of extremities with rest pain, bilateral legs: Secondary | ICD-10-CM

## 2016-10-05 DIAGNOSIS — I70222 Atherosclerosis of native arteries of extremities with rest pain, left leg: Secondary | ICD-10-CM

## 2016-10-05 DIAGNOSIS — M79605 Pain in left leg: Secondary | ICD-10-CM

## 2016-10-05 DIAGNOSIS — G35 Multiple sclerosis: Secondary | ICD-10-CM

## 2016-10-05 DIAGNOSIS — F17200 Nicotine dependence, unspecified, uncomplicated: Secondary | ICD-10-CM | POA: Diagnosis not present

## 2016-10-05 DIAGNOSIS — J449 Chronic obstructive pulmonary disease, unspecified: Secondary | ICD-10-CM | POA: Diagnosis not present

## 2016-10-05 DIAGNOSIS — I70229 Atherosclerosis of native arteries of extremities with rest pain, unspecified extremity: Secondary | ICD-10-CM | POA: Insufficient documentation

## 2016-10-05 DIAGNOSIS — E7800 Pure hypercholesterolemia, unspecified: Secondary | ICD-10-CM | POA: Diagnosis not present

## 2016-10-05 NOTE — Progress Notes (Signed)
[facility] Admission History & Physical  MRN : [id number]  TERAH VIENS is a 60 y.o. (May 13, 1956) male who presents with chief complaint of  Chief Complaint  Patient presents with  . Re-evaluation    ABI follow up ultrasound  .  History of Present Illness:  The patient is seen for follow up of painful lower extremities and diminished pulses. Patient notes the pain it  is always present worse at night when he is trying to sleep. The Norco has helped and he reports he is now out of medication.  The pain has been progressive over the past several months.  He has multiple sclerosis and is wheelchair bound. The patient states the leg pain is now having a profound negative impact on quality of life and daily activities.  No open wounds or sores at this time. No prior interventions or surgeries.  No history of back problems or DJD of the lumbar sacral spine.   The patient's blood pressure has been stable and relatively well controlled. The patient denies amaurosis fugax or recent TIA symptoms. There are no recent neurological changes noted. The patient denies history of DVT, PE or superficial thrombophlebitis. The patient denies recent episodes of angina or shortness of breath.   Duplex ultrasound of the lower extremities shows monophasic flow in the distal external iliac arteries.  No focal stenosis identified in the femoral popliteal or tibial vessels ABI's Rt=0.69 and Lt=0.59   Current Meds  Medication Sig  . Albuterol Sulfate 108 (90 Base) MCG/ACT AEPB Inhale into the lungs.  Marland Kitchen amantadine (SYMMETREL) 100 MG capsule TAKE 1 CAPSULE (100 MG TOTAL) BY MOUTH 2 (TWO) TIMES DAILY.  Marland Kitchen Amantadine HCl 100 MG tablet Take 100 mg by mouth 2 (two) times daily.  . Ascorbic Acid (VITAMIN C) 1000 MG tablet Take 1,000 mg by mouth daily.  . baclofen (LIORESAL) 20 MG tablet Take 20 mg by mouth 4 (four) times daily.  . carbamazepine (TEGRETOL) 200 MG tablet Take 200 mg by mouth 3  (three) times daily.  . Cranberry 500 MG CAPS Take 500 mg by mouth daily.  . DOCOSAHEXAENOIC ACID PO Take by mouth 3 (three) times daily.  . fluticasone-salmeterol (ADVAIR HFA) 230-21 MCG/ACT inhaler Inhale into the lungs 2 (two) times daily.  Marland Kitchen gabapentin (NEURONTIN) 300 MG capsule Take 300 mg by mouth 3 (three) times daily.  Marland Kitchen HYDROcodone-acetaminophen (NORCO) 7.5-325 MG tablet Take 1 tablet by mouth every 6 (six) hours as needed. for pain  . ipratropium (ATROVENT HFA) 17 MCG/ACT inhaler Inhale into the lungs 4 (four) times daily.  Marland Kitchen lidocaine (XYLOCAINE) 2 % jelly Apply topically as needed.  . lovastatin (MEVACOR) 40 MG tablet TAKE 1.5 TABLETS (60 MG TOTAL) BY MOUTH DAILY.  . Multiple Vitamin (MULTIVITAMIN) capsule Take by mouth daily.  . nicotine (NICODERM CQ - DOSED IN MG/24 HOURS) 21 mg/24hr patch Place 1 patch (21 mg total) onto the skin daily.  Marland Kitchen omeprazole (PRILOSEC OTC) 20 MG tablet Take 20 mg by mouth daily.  Marland Kitchen tiZANidine (ZANAFLEX) 4 MG tablet Take 8 mg by mouth 3 (three) times daily.  . traMADol (ULTRAM) 50 MG tablet Take 100 mg by mouth every 8 (eight) hours as needed.  . traZODone (DESYREL) 100 MG tablet TAKE 4 TABLET BY MOUTH AT BEDTIME  . venlafaxine XR (EFFEXOR-XR) 150 MG 24 hr capsule Take 150 mg by mouth daily.    Past Medical History:  Diagnosis Date  . 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  Cervical radiculopathy   . COPD (chronic obstructive pulmonary disease) (HCC)   . Dyspepsia   . Hyperlipidemia   . MS (multiple sclerosis) (HCC)   . Tobacco abuse     Past Surgical History:  Procedure Laterality Date  . CHOLECYSTECTOMY      Social History Social History  Substance Use Topics  . Smoking status: Current Every Day Smoker    Packs/day: 1.00    Years: 40.00    Types: Cigarettes  . Smokeless tobacco: Former NeurosurgeonUser  . Alcohol use No    Family History Family History  Problem Relation Age of Onset  . Drug abuse Mother   .  Arrhythmia Mother   . Hypertension Mother   . Hyperlipidemia Mother   . Cancer Father     lung?  . Cancer Sister     breast  . Diabetes Brother   . COPD Neg Hx   . Heart disease Neg Hx   . Stroke Neg Hx     Allergies  Allergen Reactions  . Penicillins Rash     REVIEW OF SYSTEMS (Negative unless checked)  Constitutional: [] Weight loss  [] Fever  [] Chills Cardiac: [] Chest pain   [] Chest pressure   [] Palpitations   [x] Shortness of breath when laying flat   [] Shortness of breath with exertion. Vascular:  [] Pain in legs with walking   [x] Pain in legs at rest  [] History of DVT   [] Phlebitis   [] Swelling in legs   [] Varicose veins   [] Non-healing ulcers Pulmonary:   [] Uses home oxygen   [] Productive cough   [] Hemoptysis   [] Wheeze  [x] COPD   [] Asthma Neurologic:  [] Dizziness   [] Seizures   [] History of stroke   [] History of TIA  [] Aphasia   [] Vissual changes   [x] Weakness or numbness in arm   [x] Weakness or numbness in leg Musculoskeletal:   [] Joint swelling   [x] Joint pain   [x] Low back pain Hematologic:  [] Easy bruising  [] Easy bleeding   [] Hypercoagulable state   [] Anemic Gastrointestinal:  [] Diarrhea   [] Vomiting  [] Gastroesophageal reflux/heartburn   [] Difficulty swallowing. Genitourinary:  [] Chronic kidney disease   [x] Difficult urination  [] Frequent urination   [] Blood in urine Skin:  [] Rashes   [] Ulcers  Psychological:  [] History of anxiety   []  History of major depression.  Physical Examination  Vitals:   10/05/16 1108  BP: 128/72  Pulse: 86  Resp: 17  Weight: 158 lb (71.7 kg)  Height: 5\' 11"  (1.803 m)   Body mass index is 22.04 kg/m. Gen: WD, debilitated in a wheelchair, moderate distress Head: /AT, moderate temporalis wasting.  Ear/Nose/Throat: Hearing grossly intact, nares w/o erythema or drainage, poor dentition Eyes: PER, EOMI, sclera nonicteric.  Neck: Supple, no masses.  No bruit or JVD.  Pulmonary:  Good air movement, clear to auscultation bilaterally, no  use of accessory muscles.  Cardiac: RRR, normal S1, S2, no Murmurs. Vascular: sluggish cap refill bilaterally with marked trophic changes no wounds Vessel Right Left  Radial Palpable Palpable  Ulnar Palpable Palpable  Brachial Palpable Palpable  Carotid Palpable Palpable  Femoral Not Palpable Not Palpable  Popliteal Not Palpable Not Palpable  PT Not Palpable Not Palpable  DP Not Palpable Not Palpable   Gastrointestinal: soft, non-distended. No guarding/no peritoneal signs.  Musculoskeletal: M/S 2/5 lower extremities with 4/5 upper extremities bilaterally.  Marked bilateral leg deformity with severe atrophy atrophy.  Neurologic: CN 2-12 intact. Pain and light touch intact in extremities.   Speech is fluent. Motor exam as listed above.  Patient  is wheelchair bound Psychiatric: Judgment intact, Mood & affect appropriate for pt's clinical situation. Dermatologic: No rashes or ulcers noted.  No changes consistent with cellulitis. Lymph : No Cervical lymphadenopathy, no lichenification or skin changes of chronic lymphedema.  CBC Lab Results  Component Value Date   WBC 9.1 09/15/2016   HGB 17.4 07/21/2014   HCT 49.9 09/15/2016   MCV 93 09/15/2016   PLT 231 09/15/2016    BMET    Component Value Date/Time   NA 137 09/15/2016 1045   NA 137 07/21/2014 1648   K 5.2 09/15/2016 1045   K 4.0 07/21/2014 1648   CL 96 09/15/2016 1045   CL 103 07/21/2014 1648   CO2 28 09/15/2016 1045   CO2 25 07/21/2014 1648   GLUCOSE 93 09/15/2016 1045   GLUCOSE 86 07/21/2014 1648   BUN 9 09/15/2016 1045   BUN 8 07/21/2014 1648   CREATININE 0.89 09/15/2016 1045   CREATININE 0.87 07/21/2014 1648   CALCIUM 9.4 09/15/2016 1045   CALCIUM 8.9 07/21/2014 1648   GFRNONAA 93 09/15/2016 1045   GFRNONAA >60 07/21/2014 1648   GFRAA 107 09/15/2016 1045   GFRAA >60 07/21/2014 1648   Estimated Creatinine Clearance: 89.5 mL/min (by C-G formula based on SCr of 0.89 mg/dL).  COAG No results found for: INR,  PROTIME  Radiology No results found.  Assessment/Plan 1. Atherosclerosis of native artery of left lower extremity with rest pain (HCC)  Recommend:  The patient has evidence of severe atherosclerotic changes of both lower extremities associated with ulceration and tissue loss of the foot.  This represents a limb threatening ischemia and places the patient at the risk for limb loss.  CT angiography will be performed to ensure that the aorta is able to be clamped and to define the extent of the inflow disease (since the ultrasound was not adequate for evaluating this area) and determine if the situation is not ideal for intervention.    Patient should undergo arterial reconstruction of the lower extremity with the hope for limb salvage.  CT angiogram will be utilized to define the best surgical/interventional option. The risks and benefits as well as the alternative therapies was discussed in detail with the patient.  All questions were answered.  Patient agrees to proceed with repair.  I will contact him after reviewing the CT.  If surgery is needed then he will require cardiac clearance  The patient will follow up with me in the office after the procedure.    2. Pain in both lower extremities See above leg pain secondary to ASO and MS  3. MS (multiple sclerosis) (HCC) Continue Tegretol, Neurontin and Zanaflex  medications with out interuption  4. Tobacco use disorder Smoking cessation strongly encouraged  5. Chronic obstructive pulmonary disease, unspecified COPD type (HCC) Continue Albuterol and Ipratropium    Levora Dredge, MD  10/05/2016 11:27 AM

## 2016-10-16 ENCOUNTER — Telehealth: Payer: Self-pay | Admitting: *Deleted

## 2016-10-16 NOTE — Telephone Encounter (Signed)
Received referral for initial lung cancer screening scan. Contacted patient and obtained smoking history,(current, 35 pack year) as well as answering questions related to screening process. Patient denies signs of lung cancer such as weight loss or hemoptysis. Patient denies comorbidity that would prevent curative treatment if lung cancer were found. Patient is tentatively scheduled for shared decision making visit and CT scan on 10/27/16 at 1:30pm, pending insurance approval from business office.

## 2016-10-23 ENCOUNTER — Telehealth: Payer: Self-pay | Admitting: Unknown Physician Specialty

## 2016-10-23 NOTE — Telephone Encounter (Signed)
Called the phone number provided to ask for the fax number for the rx. I spoke with Reuel Boom at [facility] and he asked for [place] to fax the rx, dempgraphics, and OV note to [phone]. Will fax information now.

## 2016-10-23 NOTE — Telephone Encounter (Signed)
Pt's home aid called stated the pt needs an order for chucks for his bedding. [facility] [phone]. Thanks.

## 2016-10-26 ENCOUNTER — Other Ambulatory Visit: Payer: Self-pay | Admitting: *Deleted

## 2016-10-26 DIAGNOSIS — Z87891 Personal history of nicotine dependence: Secondary | ICD-10-CM

## 2016-10-27 ENCOUNTER — Other Ambulatory Visit: Payer: Self-pay | Admitting: Unknown Physician Specialty

## 2016-10-27 ENCOUNTER — Ambulatory Visit: Payer: Medicare Other | Attending: Oncology

## 2016-10-27 ENCOUNTER — Inpatient Hospital Stay: Payer: Medicare Other | Admitting: Oncology

## 2016-10-27 NOTE — Telephone Encounter (Signed)
Your patient 

## 2016-11-03 ENCOUNTER — Telehealth: Payer: Self-pay | Admitting: Unknown Physician Specialty

## 2016-11-03 NOTE — Telephone Encounter (Signed)
Called the number and extension listed from the patient. A represenative did not answer so I had to leave a VM. We had already wrote a prescription and faxed it to [facility] back on 10/23/16 (see phone note from that day). I left a VM asking for a returned call to figure out what is going on.

## 2016-11-03 NOTE — Telephone Encounter (Signed)
Reginold Bayers called stated she needs Elnita Maxwell to fax [employer] a RX for the pt's bed pads. Did not provide any further information. Thanks.   [facility] Phone # [phone] ext [phone]

## 2016-11-04 NOTE — Telephone Encounter (Signed)
Spoke with Tacey Ruiz and she would like to have the information faxed to her at [phone] to her attention.

## 2016-11-04 NOTE — Telephone Encounter (Signed)
See other phone note

## 2016-11-04 NOTE — Telephone Encounter (Signed)
Information faxed as requested.

## 2016-11-12 ENCOUNTER — Telehealth: Payer: Self-pay | Admitting: Unknown Physician Specialty

## 2016-11-12 NOTE — Telephone Encounter (Signed)
Lurena Joiner the patients wife called to see if you would put an order in for chucks for the patients bed.  She states he is almost out of these.    Thank You  Clydie Braun

## 2016-11-12 NOTE — Telephone Encounter (Signed)
Phone call received from [facility] stating that they did not get the order. She asked that I fax it back to them at [phone] attention, Cat Team.

## 2016-11-12 NOTE — Telephone Encounter (Signed)
I advised the patients wife that [place] had already sent the order in twice and to contact them to see what the hold up was.

## 2016-11-17 ENCOUNTER — Telehealth: Payer: Self-pay | Admitting: *Deleted

## 2016-11-17 ENCOUNTER — Telehealth: Payer: Self-pay | Admitting: Unknown Physician Specialty

## 2016-11-17 NOTE — Telephone Encounter (Signed)
Ms Shaun Turner called and would like to get order for bedding chucks. [facility] informed her that they don't need a Rx they actually need a CMN form and a SMN form instead. Ms Shaun Turner would like a call back.

## 2016-11-17 NOTE — Telephone Encounter (Signed)
Received referral for low dose lung cancer screening CT scan. Voicemail left at phone number listed in EMR for patient to call me back to facilitate scheduling scan.  

## 2016-11-17 NOTE — Telephone Encounter (Signed)
Pt's wife called to ask [place] about his low BP.  She feels it's related to quitting smoking.  I reassured her that the BP she is stating of 111/64 is OK as long as the pt is not feeling poorly.  Also wanted [place] to know we filled out the wrong form for his chux and we shoule have gotten the right form today

## 2016-11-18 NOTE — Telephone Encounter (Signed)
Called and spoke to [place]. I let her know that we have not received a fax from [facility] regarding the patient's chucks for his bedding. I gave her our fax number and Lurena Joiner stated that she was going to call [facility] and make sure they were sending the information to the correct fax number. I asked for her to please call me back after she calls [facility].

## 2016-11-19 ENCOUNTER — Telehealth: Payer: Self-pay | Admitting: Unknown Physician Specialty

## 2016-11-19 NOTE — Telephone Encounter (Signed)
Patients wife would like for Shaun Turner to put in a referral to a home health agency if possible.    Thank You Clydie Braun

## 2016-11-20 NOTE — Telephone Encounter (Signed)
Received form to be signed for patient's chux. Signed by provider and faxed back.

## 2016-11-20 NOTE — Telephone Encounter (Signed)
I can't just put in a referral.  It usually requires a face to face visit and a reason for skilled nursing.

## 2016-11-20 NOTE — Telephone Encounter (Signed)
Called and scheduled the patient an appointment for 12/07/16.

## 2016-11-20 NOTE — Telephone Encounter (Signed)
Routing to provider  

## 2016-12-03 ENCOUNTER — Ambulatory Visit: Admission: RE | Admit: 2016-12-03 | Payer: Medicare Other | Source: Ambulatory Visit

## 2016-12-07 ENCOUNTER — Ambulatory Visit: Payer: Self-pay | Admitting: Unknown Physician Specialty

## 2016-12-09 ENCOUNTER — Other Ambulatory Visit: Payer: Self-pay | Admitting: Unknown Physician Specialty

## 2016-12-10 ENCOUNTER — Ambulatory Visit: Admission: RE | Admit: 2016-12-10 | Payer: Medicare Other | Source: Ambulatory Visit

## 2016-12-17 ENCOUNTER — Telehealth: Payer: Self-pay | Admitting: *Deleted

## 2016-12-17 NOTE — Telephone Encounter (Signed)
Email sent to patient in attempt to contact for scheduling lung screeing scan.

## 2016-12-29 ENCOUNTER — Telehealth: Payer: Self-pay | Admitting: Unknown Physician Specialty

## 2016-12-29 ENCOUNTER — Telehealth: Payer: Self-pay | Admitting: *Deleted

## 2016-12-29 DIAGNOSIS — Z87891 Personal history of nicotine dependence: Secondary | ICD-10-CM

## 2016-12-29 NOTE — Telephone Encounter (Signed)
Spouse contacted me to arrange for lung screening scan for patient and herself. Please see initial documentation from 10/16/16. Patient is scheduled for shared decision making visit and CT scan on 01/05/17.

## 2016-12-29 NOTE — Telephone Encounter (Signed)
Called and left patient's wife a VM asking for her to please return my call. There is not a mail order pharmacy listed in the patient's chart so I am not sure where we need to send the prescriptions.

## 2016-12-30 MED ORDER — TRAZODONE HCL 100 MG PO TABS: 100.0000 mg | ORAL_TABLET | Freq: Every day | ORAL | 1 refills | Status: DC

## 2016-12-30 MED ORDER — LOVASTATIN 40 MG PO TABS: 60.0000 mg | ORAL_TABLET | Freq: Every day | ORAL | 0 refills | Status: DC

## 2016-12-30 NOTE — Telephone Encounter (Signed)
Routing to provider. Patient requests 90 day supplies of trazodone and lovastatin if possible.

## 2016-12-30 NOTE — Telephone Encounter (Signed)
Called and left patient's wife a VM asking for her to please return my call. Also tried calling the phone number listed for the patient and did not get an answer or VM box.

## 2016-12-31 ENCOUNTER — Other Ambulatory Visit (INDEPENDENT_AMBULATORY_CARE_PROVIDER_SITE_OTHER): Payer: Self-pay

## 2017-01-01 ENCOUNTER — Other Ambulatory Visit: Payer: Self-pay

## 2017-01-01 ENCOUNTER — Ambulatory Visit: Payer: Medicare Other

## 2017-01-01 ENCOUNTER — Ambulatory Visit (INDEPENDENT_AMBULATORY_CARE_PROVIDER_SITE_OTHER): Payer: Medicare Other | Admitting: Family Medicine

## 2017-01-01 ENCOUNTER — Encounter: Payer: Self-pay | Admitting: Family Medicine

## 2017-01-01 VITALS — BP 114/71 | HR 91 | Temp 97.7°F

## 2017-01-01 DIAGNOSIS — J441 Chronic obstructive pulmonary disease with (acute) exacerbation: Secondary | ICD-10-CM

## 2017-01-01 DIAGNOSIS — K219 Gastro-esophageal reflux disease without esophagitis: Secondary | ICD-10-CM | POA: Diagnosis not present

## 2017-01-01 DIAGNOSIS — G4700 Insomnia, unspecified: Secondary | ICD-10-CM

## 2017-01-01 MED ORDER — PREDNISONE 10 MG PO TABS: ORAL_TABLET | ORAL | 0 refills | Status: AC

## 2017-01-01 MED ORDER — OMEPRAZOLE 40 MG PO CPDR: 40.0000 mg | DELAYED_RELEASE_CAPSULE | Freq: Every day | ORAL | 1 refills | Status: AC

## 2017-01-01 MED ORDER — LOVASTATIN 40 MG PO TABS: 60.0000 mg | ORAL_TABLET | Freq: Every day | ORAL | 0 refills | Status: AC

## 2017-01-01 MED ORDER — FLUTICASONE-SALMETEROL 230-21 MCG/ACT IN AERO: 2.0000 | INHALATION_SPRAY | Freq: Two times a day (BID) | RESPIRATORY_TRACT | 11 refills | Status: AC

## 2017-01-01 MED ORDER — IPRATROPIUM BROMIDE HFA 17 MCG/ACT IN AERS: 1.0000 | INHALATION_SPRAY | Freq: Four times a day (QID) | RESPIRATORY_TRACT | 11 refills | Status: AC | PRN

## 2017-01-01 MED ORDER — AZITHROMYCIN 250 MG PO TABS: ORAL_TABLET | ORAL | 0 refills | Status: AC

## 2017-01-01 MED ORDER — ALBUTEROL SULFATE 108 (90 BASE) MCG/ACT IN AEPB: 2.0000 | INHALATION_SPRAY | RESPIRATORY_TRACT | 11 refills | Status: AC | PRN

## 2017-01-01 MED ORDER — TRAZODONE HCL 100 MG PO TABS: 400.0000 mg | ORAL_TABLET | Freq: Every day | ORAL | 12 refills | Status: AC

## 2017-01-01 NOTE — Telephone Encounter (Signed)
[place], can we confirm with pt or pharmcy that he takes 4 100 mg tabs?  Or 4 50 mg tabs?  Thanks.

## 2017-01-01 NOTE — Telephone Encounter (Signed)
Tried calling patient and wife about trazodone but did not get an answer. I then called [facility] and the pharmacy tech told me that a Dr. Greggory Stallion was writing patient's trazodone for 100 mg, 4 tablets at bedtime.

## 2017-01-01 NOTE — Assessment & Plan Note (Addendum)
Acute exacerbation from URI. Will start azithromycin and a prednisone taper. Continue inhaler regimen, follow up if worsening or no improvement. Limit smoking as much as possible

## 2017-01-01 NOTE — Telephone Encounter (Signed)
Pt in to see Fleet Contras today. States Trazodone Rx was sent in as QHS and he has been taking it QID. Would like new RX sent in with correct Sig. Pt also stated he needed refill on his lovastatin.   Lab Results  Component Value Date   CHOL 182 09/15/2016   HDL 43 09/15/2016   LDLCALC 78 09/15/2016   TRIG 307 (H) 09/15/2016

## 2017-01-01 NOTE — Telephone Encounter (Signed)
Patients wife wanted to make sure Elnita Maxwell sent the correct medication to [facility] for patients  Trazodone 100mg  4 @ bedtime with 90 day supply Lovastatin 40mg  90 day supply

## 2017-01-01 NOTE — Assessment & Plan Note (Signed)
Prilosec sent, can continue TUMs prn for breakthrough reflux. Discussed propping up head of bed, not eating late, and having bland foods only

## 2017-01-01 NOTE — Progress Notes (Signed)
BP 114/71   Pulse 91   Temp 97.7 F (36.5 C) (Oral)   SpO2 94%    Subjective:    Patient ID: Shaun Turner, male    DOB: 08-09-1956, 61 y.o.   MRN: [id number]  HPI: [address] is a 61 y.o. male  Chief Complaint  Patient presents with  . Cough  . Chest congestion  . Chest Pain    x 1 week   Patient presents with 1 week of cough, congestion, SOB, and wheezing. Denies fever, chills, aches, sore throat, ear pain. Has been using inhalers faithfully and has tried OTC cough medicines which he states didn't help much. Still smoking 1 ppd. No sick contacts reported.  Also having significantly worsening GERD symptoms the past week or so. Taking rolaids and TUMs with minimal relief. Trying to sleep propped up but still waking around 2 or 3 with the discomfort.   Not sleeping well on the trazodone. Taking 4 tablets nightly and would like his script changed to reflect this dosage so he doesn't run out too quickly. Wanting to know if something can be added to help him further.   Past Medical History:  Diagnosis Date  . 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 Cervical radiculopathy   . COPD (chronic obstructive pulmonary disease) (HCC)   . Dyspepsia   . Hyperlipidemia   . MS (multiple sclerosis) (HCC)   . Tobacco abuse    Social History   Social History  . Marital status: Married    Spouse name: N/A  . Number of children: N/A  . Years of education: N/A   Occupational History  . Not on file.   Social History Main Topics  . Smoking status: Current Every Day Smoker    Packs/day: 1.00    Years: 40.00    Types: Cigarettes  . Smokeless tobacco: Former Neurosurgeon  . Alcohol use No  . Drug use: No  . Sexual activity: Not on file   Other Topics Concern  . Not on file   Social History Narrative  . No narrative on file    Relevant past medical, surgical, family and social history reviewed and updated as indicated. Interim medical history since our  last visit reviewed. Allergies and medications reviewed and updated.  Review of Systems  Constitutional: Negative.   HENT: Positive for congestion.   Respiratory: Positive for cough, chest tightness, shortness of breath and wheezing.   Cardiovascular: Negative.   Gastrointestinal:       Reflux  Genitourinary: Negative.   Musculoskeletal: Negative.   Neurological: Negative.   Psychiatric/Behavioral: Positive for sleep disturbance (insomnia).    Per HPI unless specifically indicated above     Objective:    BP 114/71   Pulse 91   Temp 97.7 F (36.5 C) (Oral)   SpO2 94%   Wt Readings from Last 3 Encounters:  10/05/16 158 lb (71.7 kg)  09/07/16 160 lb (72.6 kg)  12/19/15 150 lb (68 kg)    Physical Exam  Constitutional: He is oriented to person, place, and time. He appears well-developed and well-nourished. No distress.  HENT:  Head: Atraumatic.  Right Ear: External ear normal.  Left Ear: External ear normal.  Nose: Nose normal.  Mouth/Throat: Oropharynx is clear and moist. No oropharyngeal exudate.  Eyes: Conjunctivae are normal. Pupils are equal, round, and reactive to light.  Neck: Normal range of motion. Neck supple.  Cardiovascular: Normal rate and normal heart sounds.   Pulmonary/Chest: Effort normal.  He has wheezes.  Neurological: He is alert and oriented to person, place, and time.  Skin: Skin is warm and dry.  Psychiatric: He has a normal mood and affect. His behavior is normal.  Nursing note and vitals reviewed.       Assessment & Plan:   Problem List Items Addressed This Visit      Respiratory   COPD (chronic obstructive pulmonary disease) (HCC) - Primary    Acute exacerbation from URI. Will start azithromycin and a prednisone taper. Continue inhaler regimen, follow up if worsening or no improvement. Limit smoking as much as possible      Relevant Medications   predniSONE (DELTASONE) 10 MG tablet   azithromycin (ZITHROMAX) 250 MG tablet    fluticasone-salmeterol (ADVAIR HFA) 230-21 MCG/ACT inhaler   Albuterol Sulfate 108 (90 Base) MCG/ACT AEPB   ipratropium (ATROVENT HFA) 17 MCG/ACT inhaler     Digestive   GERD (gastroesophageal reflux disease)    Prilosec sent, can continue TUMs prn for breakthrough reflux. Discussed propping up head of bed, not eating late, and having bland foods only      Relevant Medications   omeprazole (PRILOSEC) 40 MG capsule    Other Visit Diagnoses    Insomnia, unspecified type       Discussed that 400 mg is too much and that he is only to take 100 mg nightly. Can try melatonin as well for sleep.        Follow up plan: Return for as scheduled.

## 2017-01-01 NOTE — Patient Instructions (Signed)
Follow up as needed

## 2017-01-05 ENCOUNTER — Inpatient Hospital Stay: Payer: Medicare Other | Admitting: Oncology

## 2017-01-05 ENCOUNTER — Ambulatory Visit: Admission: RE | Admit: 2017-01-05 | Payer: Medicare Other | Source: Ambulatory Visit

## 2017-01-05 ENCOUNTER — Telehealth: Payer: Self-pay | Admitting: Unknown Physician Specialty

## 2017-01-05 MED ORDER — DOXYCYCLINE HYCLATE 100 MG PO TABS: 100.0000 mg | ORAL_TABLET | Freq: Two times a day (BID) | ORAL | 0 refills | Status: AC

## 2017-01-05 NOTE — Telephone Encounter (Signed)
Doxycycline sent to pharmacy, ok to d/c last dose of zpak if not already completed

## 2017-01-05 NOTE — Telephone Encounter (Signed)
Called and spoke with patient's wife. I explained to her what the call was about last week about the patient's trazodone (see other phone note). Patient's wife stated that the patient is not any better from his recent visit with Fleet Contras and would like something else sent in to [facility].

## 2017-01-05 NOTE — Telephone Encounter (Signed)
Lurena Joiner patients wife came by requesting to speak with Elnita Maxwell.  I told her I would send a message to her.  She said he needs another antibiotic for his broncitis and also Elnita Maxwell wanted to speak with Ms. Lurena Joiner about one of the patients medications.  They had to reschedule his MRI of his chest due to his bronchitis.  Thanks

## 2017-01-06 ENCOUNTER — Encounter: Payer: Self-pay | Admitting: Emergency Medicine

## 2017-01-06 ENCOUNTER — Emergency Department: Payer: Medicare Other

## 2017-01-06 DIAGNOSIS — I214 Non-ST elevation (NSTEMI) myocardial infarction: Principal | ICD-10-CM | POA: Diagnosis present

## 2017-01-06 DIAGNOSIS — J9600 Acute respiratory failure, unspecified whether with hypoxia or hypercapnia: Secondary | ICD-10-CM | POA: Diagnosis present

## 2017-01-06 DIAGNOSIS — Z8249 Family history of ischemic heart disease and other diseases of the circulatory system: Secondary | ICD-10-CM

## 2017-01-06 DIAGNOSIS — F17210 Nicotine dependence, cigarettes, uncomplicated: Secondary | ICD-10-CM | POA: Diagnosis present

## 2017-01-06 DIAGNOSIS — J9620 Acute and chronic respiratory failure, unspecified whether with hypoxia or hypercapnia: Secondary | ICD-10-CM | POA: Diagnosis present

## 2017-01-06 DIAGNOSIS — Z452 Encounter for adjustment and management of vascular access device: Secondary | ICD-10-CM

## 2017-01-06 DIAGNOSIS — G1221 Amyotrophic lateral sclerosis: Secondary | ICD-10-CM | POA: Diagnosis present

## 2017-01-06 DIAGNOSIS — Z4659 Encounter for fitting and adjustment of other gastrointestinal appliance and device: Secondary | ICD-10-CM

## 2017-01-06 DIAGNOSIS — I462 Cardiac arrest due to underlying cardiac condition: Secondary | ICD-10-CM | POA: Diagnosis present

## 2017-01-06 DIAGNOSIS — N179 Acute kidney failure, unspecified: Secondary | ICD-10-CM | POA: Diagnosis present

## 2017-01-06 DIAGNOSIS — Z833 Family history of diabetes mellitus: Secondary | ICD-10-CM

## 2017-01-06 DIAGNOSIS — Z7951 Long term (current) use of inhaled steroids: Secondary | ICD-10-CM

## 2017-01-06 DIAGNOSIS — E785 Hyperlipidemia, unspecified: Secondary | ICD-10-CM | POA: Diagnosis present

## 2017-01-06 DIAGNOSIS — J189 Pneumonia, unspecified organism: Secondary | ICD-10-CM | POA: Diagnosis present

## 2017-01-06 DIAGNOSIS — E872 Acidosis: Secondary | ICD-10-CM | POA: Diagnosis present

## 2017-01-06 DIAGNOSIS — Z880 Allergy status to penicillin: Secondary | ICD-10-CM

## 2017-01-06 DIAGNOSIS — G35 Multiple sclerosis: Secondary | ICD-10-CM | POA: Diagnosis present

## 2017-01-06 DIAGNOSIS — Z801 Family history of malignant neoplasm of trachea, bronchus and lung: Secondary | ICD-10-CM

## 2017-01-06 DIAGNOSIS — K219 Gastro-esophageal reflux disease without esophagitis: Secondary | ICD-10-CM | POA: Diagnosis present

## 2017-01-06 DIAGNOSIS — I472 Ventricular tachycardia: Secondary | ICD-10-CM | POA: Diagnosis present

## 2017-01-06 DIAGNOSIS — J440 Chronic obstructive pulmonary disease with acute lower respiratory infection: Secondary | ICD-10-CM | POA: Diagnosis present

## 2017-01-06 DIAGNOSIS — Z803 Family history of malignant neoplasm of breast: Secondary | ICD-10-CM

## 2017-01-06 DIAGNOSIS — Z9049 Acquired absence of other specified parts of digestive tract: Secondary | ICD-10-CM

## 2017-01-06 DIAGNOSIS — Z66 Do not resuscitate: Secondary | ICD-10-CM | POA: Diagnosis present

## 2017-01-06 DIAGNOSIS — Z79899 Other long term (current) drug therapy: Secondary | ICD-10-CM

## 2017-01-06 DIAGNOSIS — Z978 Presence of other specified devices: Secondary | ICD-10-CM

## 2017-01-06 LAB — BASIC METABOLIC PANEL
Anion gap: 10 (ref 5–15)
BUN: 22 mg/dL — AB (ref 6–20)
CALCIUM: 8.7 mg/dL — AB (ref 8.9–10.3)
CO2: 30 mmol/L (ref 22–32)
CREATININE: 1.52 mg/dL — AB (ref 0.61–1.24)
Chloride: 94 mmol/L — ABNORMAL LOW (ref 101–111)
GFR calc non Af Amer: 48 mL/min — ABNORMAL LOW (ref >60)
GFR, EST AFRICAN AMERICAN: 55 mL/min — AB (ref >60)
Glucose, Bld: 104 mg/dL — ABNORMAL HIGH (ref 65–99)
Potassium: 4.1 mmol/L (ref 3.5–5.1)
SODIUM: 134 mmol/L — AB (ref 135–145)

## 2017-01-06 LAB — CBC
HCT: 35.1 % — ABNORMAL LOW (ref 40.0–52.0)
Hemoglobin: 12.4 g/dL — ABNORMAL LOW (ref 13.0–18.0)
MCH: 32.4 pg (ref 26.0–34.0)
MCHC: 35.2 g/dL (ref 32.0–36.0)
MCV: 92.2 fL (ref 80.0–100.0)
PLATELETS: 322 10*3/uL (ref 150–440)
RBC: 3.81 MIL/uL — AB (ref 4.40–5.90)
RDW: 14 % (ref 11.5–14.5)
WBC: 17.7 10*3/uL — AB (ref 3.8–10.6)

## 2017-01-06 LAB — TROPONIN I: TROPONIN I: 3.83 ng/mL — AB (ref <0.03)

## 2017-01-06 MED ORDER — IPRATROPIUM-ALBUTEROL 0.5-2.5 (3) MG/3ML IN SOLN
3.0000 mL | Freq: Once | RESPIRATORY_TRACT | Status: AC
  Administered 2017-01-06: 3 mL via RESPIRATORY_TRACT

## 2017-01-06 MED ORDER — VANCOMYCIN HCL IN DEXTROSE 1-5 GM/200ML-% IV SOLN
1000.0000 mg | Freq: Once | INTRAVENOUS | Status: AC
  Administered 2017-01-07: 1000 mg via INTRAVENOUS
  Filled 2017-01-06: qty 200

## 2017-01-06 MED ORDER — DEXTROSE 5 % IV SOLN
2.0000 g | Freq: Once | INTRAVENOUS | Status: DC
  Filled 2017-01-06: qty 2

## 2017-01-06 MED ORDER — IPRATROPIUM-ALBUTEROL 0.5-2.5 (3) MG/3ML IN SOLN
RESPIRATORY_TRACT | Status: AC
  Administered 2017-01-06: 3 mL via RESPIRATORY_TRACT
  Filled 2017-01-06: qty 6

## 2017-01-06 MED ORDER — METHYLPREDNISOLONE SODIUM SUCC 125 MG IJ SOLR
INTRAMUSCULAR | Status: AC
  Administered 2017-01-06: 125 mg via INTRAVENOUS
  Filled 2017-01-06: qty 2

## 2017-01-06 MED ORDER — SODIUM CHLORIDE 0.9 % IV BOLUS (SEPSIS)
250.0000 mL | Freq: Once | INTRAVENOUS | Status: AC
Start: 2017-01-07 — End: 2017-01-07
  Administered 2017-01-07: 250 mL via INTRAVENOUS

## 2017-01-06 MED ORDER — SODIUM CHLORIDE 0.9 % IV BOLUS (SEPSIS)
1000.0000 mL | Freq: Once | INTRAVENOUS | Status: AC
  Administered 2017-01-07: 1000 mL via INTRAVENOUS

## 2017-01-06 MED ORDER — LEVOFLOXACIN IN D5W 750 MG/150ML IV SOLN
750.0000 mg | Freq: Once | INTRAVENOUS | Status: AC
  Administered 2017-01-07: 750 mg via INTRAVENOUS
  Filled 2017-01-06: qty 150

## 2017-01-06 MED ORDER — METHYLPREDNISOLONE SODIUM SUCC 125 MG IJ SOLR
125.0000 mg | Freq: Once | INTRAMUSCULAR | Status: AC
  Administered 2017-01-06: 125 mg via INTRAVENOUS

## 2017-01-06 MED ORDER — SODIUM CHLORIDE 0.9 % IV BOLUS (SEPSIS)
1000.0000 mL | Freq: Once | INTRAVENOUS | Status: AC
  Administered 2017-01-06: 1000 mL via INTRAVENOUS

## 2017-01-06 MED ORDER — ASPIRIN 81 MG PO CHEW
324.0000 mg | CHEWABLE_TABLET | Freq: Once | ORAL | Status: AC
  Administered 2017-01-07: 324 mg via ORAL
  Filled 2017-01-06: qty 4

## 2017-01-06 NOTE — ED Triage Notes (Signed)
Pt comes into the ED via EMS from home c/o respiratory distress.  Patient has COPD and had room air sats at 75%.  Patient presents on non-rebreather and back up to 95%.  Patient has shallow and labored breathing at this time. HR 140-150 in route.  Patient has h/o MS.

## 2017-01-06 NOTE — ED Provider Notes (Signed)
[facility] Emergency Department Provider Note    First MD Initiated Contact with Patient 12/30/2016 2310     (approximate)  I have reviewed the triage vital signs and the nursing notes.   HISTORY  Chief Complaint Respiratory Distress   HPI Shaun Turner is a 61 y.o. male with bolus of chronic medical conditions including COPD presents to the emergency department with 2 week history of progressive dyspnea with acute worsening tonight at approximately 8 PM. Patient states that he is currently taking an unknown antibiotic secondary to this illness however symptoms as progressively worsened over that period of time. Patient denies any fever. Per EMS on arrival patient's oxygen saturation 75% on room air patient is not on baseline O2. On arrival to the emergency department patient with apparent rest or distress increased work of breathing O2 saturation 95% on nonrebreather. Patient states that he uses home nebulizer albuterol treatment without any relief approximate 45 minutes before presentation.   Past Medical History:  Diagnosis Date  . 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 Cervical radiculopathy   . COPD (chronic obstructive pulmonary disease) (HCC)   . Dyspepsia   . Hyperlipidemia   . MS (multiple sclerosis) (HCC)   . Tobacco abuse     Patient Active Problem List   Diagnosis Date Noted  . Acute respiratory failure (HCC) Jan 08, 2017  . Atherosclerosis of native arteries of extremity with rest pain (HCC) 10/05/2016  . Leg pain, bilateral 09/15/2016  . PVD (peripheral vascular disease) with claudication (HCC) 09/01/2016  . Hyperlipidemia 07/03/2016  . MS (multiple sclerosis) (HCC) 07/03/2016  . Tobacco use disorder 07/03/2016  . COPD (chronic obstructive pulmonary disease) (HCC) 07/03/2016  . GERD (gastroesophageal reflux disease) 07/03/2016    Past Surgical History:  Procedure Laterality Date  . CHOLECYSTECTOMY       Prior to Admission medications   Medication Sig Start Date End Date Taking? Authorizing Provider  Albuterol Sulfate 108 (90 Base) MCG/ACT AEPB Inhale 2 puffs into the lungs every 4 (four) hours as needed. 01/01/17  Yes Particia Nearing, PA-C  amantadine (SYMMETREL) 100 MG capsule TAKE 1 CAPSULE (100 MG TOTAL) BY MOUTH 2 (TWO) TIMES DAILY. 09/11/16  Yes Gabriel Cirri, NP  azithromycin (ZITHROMAX) 250 MG tablet Take 2 tablets day one, then 1 tablet daily 01/01/17  Yes Particia Nearing, PA-C  baclofen (LIORESAL) 20 MG tablet Take 20 mg by mouth 4 (four) times daily. 12/24/15  Yes Historical Provider, MD  doxycycline (VIBRA-TABS) 100 MG tablet Take 1 tablet (100 mg total) by mouth 2 (two) times daily. 01/05/17  Yes Particia Nearing, PA-C  fluticasone-salmeterol (ADVAIR [employer]) 230-21 MCG/ACT inhaler Inhale 2 puffs into the lungs 2 (two) times daily. 01/01/17 01/01/18 Yes Particia Nearing, PA-C  gabapentin (NEURONTIN) 300 MG capsule Take 300 mg by mouth 3 (three) times daily. 12/24/15 2017-01-08 Yes Historical Provider, MD  ipratropium (ATROVENT HFA) 17 MCG/ACT inhaler Inhale 1 puff into the lungs every 6 (six) hours as needed for wheezing. 01/01/17 01/01/18 Yes Particia Nearing, PA-C  lovastatin (MEVACOR) 40 MG tablet Take 1.5 tablets (60 mg total) by mouth daily. 01/01/17  Yes Gabriel Cirri, NP  omeprazole (PRILOSEC) 40 MG capsule Take 1 capsule (40 mg total) by mouth daily. 01/01/17  Yes Particia Nearing, PA-C  predniSONE (DELTASONE) 10 MG tablet 6 tabs daily x 3 days, then 5 tabs daily x 3 days etc 01/01/17  Yes Particia Nearing, PA-C  traZODone (DESYREL) 100 MG  tablet Take 4 tablets (400 mg total) by mouth at bedtime. 01/01/17  Yes Gabriel Cirri, NP  venlafaxine XR (EFFEXOR-XR) 150 MG 24 hr capsule Take 150 mg by mouth daily. 06/27/15  Yes Historical Provider, MD    Allergies Penicillins  Family History  Problem Relation Age of Onset  . Drug abuse Mother   . Arrhythmia  Mother   . Hypertension Mother   . Hyperlipidemia Mother   . Cancer Father     lung?  . Cancer Sister     breast  . Diabetes Brother   . COPD Neg Hx   . Heart disease Neg Hx   . Stroke Neg Hx     Social History Social History  Substance Use Topics  . Smoking status: Current Every Day Smoker    Packs/day: 1.00    Years: 40.00    Types: Cigarettes  . Smokeless tobacco: Former Neurosurgeon  . Alcohol use No    Review of Systems Constitutional: No fever/chills Eyes: No visual changes. ENT: No sore throat. Cardiovascular: Denies chest pain. Respiratory: Positive for nonproductive cough dyspnea and rest or distress. Gastrointestinal: No abdominal pain.  No nausea, no vomiting.  No diarrhea.  No constipation. Genitourinary: Negative for dysuria. Musculoskeletal: Negative for back pain. Skin: Negative for rash. Neurological: Negative for headaches, focal weakness or numbness.  10-point ROS otherwise negative.  ____________________________________________   PHYSICAL EXAM:  VITAL SIGNS: ED Triage Vitals  Enc Vitals Group     BP 2017/01/23 2304 102/84     Pulse Rate January 23, 2017 2304 (!) 144     Resp January 23, 2017 2304 (!) 29     Temp --      Temp src --      SpO2 January 23, 2017 2304 (!) 89 %     Weight 01/23/2017 2307 160 lb (72.6 kg)     Height 2017/01/23 2307 5\' 7"  (1.702 m)     Head Circumference --      Peak Flow --      Pain Score --      Pain Loc --      Pain Edu? --      Excl. in GC? --     Constitutional: Alert and oriented.Apparent respiratory distress Eyes: Conjunctivae are normal. PERRL. EOMI. Head: Atraumatic. Nose: No congestion/rhinnorhea. Mouth/Throat: Mucous membranes are moist.  Oropharynx non-erythematous. Neck: No stridor.  No meningeal signs.  No cervical spine tenderness to palpation. Cardiovascular: Normal rate, regular rhythm. Good peripheral circulation. Grossly normal heart sounds. Respiratory: Tachypnea, positive accessory respiratory muscle use, diffuse  rhonchi.. Gastrointestinal: Soft and nontender. No distention.  Musculoskeletal: No lower extremity tenderness nor edema. No gross deformities of extremities. Neurologic:  Normal speech and language. No gross focal neurologic deficits are appreciated.  Skin:  Skin is warm, dry and intact. No rash noted. Psychiatric: Mood and affect are normal. Speech and behavior are normal.  ____________________________________________   LABS (all labs ordered are listed, but only abnormal results are displayed)  Labs Reviewed  BASIC METABOLIC PANEL - Abnormal; Notable for the following:       Result Value   Sodium 134 (*)    Chloride 94 (*)    Glucose, Bld 104 (*)    BUN 22 (*)    Creatinine, Ser 1.52 (*)    Calcium 8.7 (*)    GFR calc non Af Amer 48 (*)    GFR calc Af Amer 55 (*)    All other components within normal limits  CBC - Abnormal; Notable for the  following:    WBC 17.7 (*)    RBC 3.81 (*)    Hemoglobin 12.4 (*)    HCT 35.1 (*)    All other components within normal limits  TROPONIN I - Abnormal; Notable for the following:    Troponin I 3.83 (*)    All other components within normal limits  BLOOD GAS, VENOUS - Abnormal; Notable for the following:    Bicarbonate 31.5 (*)    Acid-Base Excess 4.1 (*)    All other components within normal limits  LACTIC ACID, PLASMA - Abnormal; Notable for the following:    Lactic Acid, Venous 4.0 (*)    All other components within normal limits  CBC - Abnormal; Notable for the following:    WBC 22.3 (*)    RBC 3.96 (*)    Hemoglobin 12.7 (*)    HCT 38.2 (*)    All other components within normal limits  BASIC METABOLIC PANEL - Abnormal; Notable for the following:    Sodium 134 (*)    Chloride 100 (*)    CO2 21 (*)    Glucose, Bld 187 (*)    BUN 21 (*)    Creatinine, Ser 1.44 (*)    Calcium 7.7 (*)    GFR calc non Af Amer 51 (*)    GFR calc Af Amer 59 (*)    All other components within normal limits  TROPONIN I - Abnormal; Notable for  the following:    Troponin I 3.25 (*)    All other components within normal limits  GLUCOSE, CAPILLARY - Abnormal; Notable for the following:    Glucose-Capillary 143 (*)    All other components within normal limits  BLOOD GAS, ARTERIAL - Abnormal; Notable for the following:    pH, Arterial 7.06 (*)    pCO2 arterial 63 (*)    pO2, Arterial 243 (*)    Bicarbonate 17.8 (*)    Acid-base deficit 12.8 (*)    All other components within normal limits  BASIC METABOLIC PANEL - Abnormal; Notable for the following:    Potassium 5.7 (*)    CO2 21 (*)    Glucose, Bld 268 (*)    BUN 21 (*)    Creatinine, Ser 1.54 (*)    Calcium 6.3 (*)    GFR calc non Af Amer 47 (*)    GFR calc Af Amer 54 (*)    All other components within normal limits  TROPONIN I - Abnormal; Notable for the following:    Troponin I 5.62 (*)    All other components within normal limits  CULTURE, BLOOD (ROUTINE X 2)  CULTURE, BLOOD (ROUTINE X 2)  MRSA PCR SCREENING  CULTURE, RESPIRATORY (NON-EXPECTORATED)  LACTIC ACID, PLASMA  MAGNESIUM  PHOSPHORUS  PROCALCITONIN  URINALYSIS, COMPLETE (UACMP) WITH MICROSCOPIC  TROPONIN I  TROPONIN I  HIV ANTIBODY (ROUTINE TESTING)  MAGNESIUM  PHOSPHORUS  TROPONIN I  TROPONIN I  TROPONIN I  TROPONIN I  BASIC METABOLIC PANEL  BASIC METABOLIC PANEL  APTT  BLOOD GAS, ARTERIAL  VANCOMYCIN, TROUGH   ____________________________________________  EKG  ED ECG REPORT I, McLendon-Chisholm N BROWN, the attending physician, personally viewed and interpreted this ECG.   Date: 01/05/2017  EKG Time: 11:06 PM  Rate: 144  Rhythm: Sinus tachycardia  Axis: Normal  Intervals: Normal  ST&T Change: None  ____________________________________________  RADIOLOGY I, Redfield N BROWN, personally viewed and evaluated these images (plain radiographs) as part of my medical decision making, as well as reviewing  the written report by the radiologist.  Dg Chest Port 1 View  Result Date:  12/29/2016 CLINICAL DATA:  Cardiac arrest EXAM: PORTABLE CHEST 1 VIEW COMPARISON:  Chest radiograph 27-Jan-2017 FINDINGS: Endotracheal tube tip is just below the level of the clavicular heads, in radiographically appropriate position. Nasogastric tube courses below the diaphragm. The side port projects over the upper stomach. Right internal jugular vein approach central venous catheter tip overlies the lower SVC. There are right greater than left airspace opacities. Cardiac silhouette is unchanged. IMPRESSION: 1. Endotracheal tube tip just below the clavicular heads, in radiographically appropriate position. 2. Right IJ CVC tip at the lower SVC. 3. Worsening bilateral airspace opacities, particularly on the right, likely pulmonary edema. Electronically Signed   By: Deatra Robinson M.D.   On: 12/19/2016 04:45   Dg Chest Port 1 View  Result Date: 2017-01-27 CLINICAL DATA:  61 year old male with respiratory distress.  COPD. EXAM: PORTABLE CHEST 1 VIEW COMPARISON:  Chest radiograph dated 08/04/2007 FINDINGS: There is emphysematous changes of the lungs with chronic interstitial coarsening. There is diffuse reticulonodular interstitial densities and mild bronchiectasis. No focal consolidation. There is no pleural effusion or pneumothorax. There is mild cardiomegaly. No acute osseous pathology. IMPRESSION: Emphysema with reticulonodular interstitial prominence primarily involving the right lung. No focal consolidation or pneumothorax. Findings may represent interstitial edema or atypical pneumonia. Clinical correlation is recommended. Electronically Signed   By: Elgie Collard M.D.   On: Jan 27, 2017 23:32      Procedures   Critical Care performed: CRITICAL CARE Performed by: Darci Current   Total critical care time: 45 minutes  Critical care time was exclusive of separately billable procedures and treating other patients.  Critical care was necessary to treat or prevent imminent or life-threatening  deterioration.  Critical care was time spent personally by me on the following activities: development of treatment plan with patient and/or surrogate as well as nursing, discussions with consultants, evaluation of patient's response to treatment, examination of patient, obtaining history from patient or surrogate, ordering and performing treatments and interventions, ordering and review of laboratory studies, ordering and review of radiographic studies, pulse oximetry and re-evaluation of patient's condition. ____________   INITIAL IMPRESSION / ASSESSMENT AND PLAN / ED COURSE  Pertinent labs & imaging results that were available during my care of the patient were reviewed by me and considered in my medical decision making (see chart for details).   Patient presented emergency department with respiratory distress tachypnea accessory respiratory muscle use and a such BiPAP was applied. Concern for possible pneumonia as such patient received broad-spectrum antibiotics. DuoNeb's 2 were given as well as Solu-Medrol 125 mg. In addition patient's troponin noted to be 3.8. As such patient discussed with Dr. Melvenia Beam intensivists who will admit the patient for further evaluation and management  ____________________________________________  FINAL CLINICAL IMPRESSION(S) / ED DIAGNOSES Sepsis Pneumonia NSTEMI   MEDICATIONS GIVEN DURING THIS VISIT:  Medications  aztreonam (AZACTAM) 2 g in dextrose 5 % 50 mL IVPB (2 g Intravenous Not Given 01/01/2017 0625)  vancomycin (VANCOCIN) IVPB 1000 mg/200 mL premix (not administered)  0.9 %  sodium chloride infusion (not administered)  ondansetron (ZOFRAN) injection 4 mg (not administered)  chlorhexidine (PERIDEX) 0.12 % solution 15 mL (not administered)  MEDLINE mouth rinse (not administered)  enoxaparin (LOVENOX) injection 75 mg (not administered)  0.9 %  sodium chloride infusion ( Intravenous Stopped 12/21/2016 0600)  acetaminophen (TYLENOL) tablet 650 mg (not  administered)  amantadine (SYMMETREL) capsule 100 mg (not administered)  aspirin chewable tablet 81 mg (not administered)  baclofen (LIORESAL) tablet 20 mg (20 mg Per Tube Not Given 12/21/2016 0625)  gabapentin (NEURONTIN) capsule 300 mg (not administered)  ipratropium-albuterol (DUONEB) 0.5-2.5 (3) MG/3ML nebulizer solution 3 mL (not administered)  ipratropium-albuterol (DUONEB) 0.5-2.5 (3) MG/3ML nebulizer solution 3 mL (not administered)  methylPREDNISolone sodium succinate (SOLU-MEDROL) 40 mg/mL injection 40 mg (not administered)  metoprolol (LOPRESSOR) injection 2.5-5 mg (5 mg Intravenous Given 12/31/2016 0320)  pantoprazole (PROTONIX) injection 40 mg (40 mg Intravenous Not Given 12/26/2016 0300)  pravastatin (PRAVACHOL) tablet 20 mg (not administered)  fentaNYL (SUBLIMAZE) injection 100 mcg (not administered)  fentaNYL (SUBLIMAZE) injection 100 mcg (not administered)  midazolam (VERSED) injection 2 mg (not administered)  midazolam (VERSED) injection 2 mg (not administered)  docusate (COLACE) 50 MG/5ML liquid 100 mg (not administered)  bisacodyl (DULCOLAX) suppository 10 mg (not administered)  norepinephrine (LEVOPHED) 4 mg in dextrose 5 % 250 mL (0.016 mg/mL) infusion (0 mcg/min Intravenous Stopped 01/11/2017 0600)  metoprolol tartrate (LOPRESSOR) tablet 25 mg (25 mg Per Tube Not Given 12/18/2016 0439)  sodium chloride flush (NS) 0.9 % injection 10-40 mL (10 mLs Intracatheter Given 12/26/2016 0440)  sodium chloride flush (NS) 0.9 % injection 10-40 mL (not administered)  levofloxacin (LEVAQUIN) IVPB 750 mg (not administered)  amiodarone (NEXTERONE PREMIX) 360-4.14 MG/200ML-% (1.8 mg/mL) IV infusion (60 mg/hr Intravenous New Bag/Given 12/22/2016 0423)  amiodarone (NEXTERONE PREMIX) 360-4.14 MG/200ML-% (1.8 mg/mL) IV infusion (not administered)  vasopressin (PITRESSIN) 40 Units in sodium chloride 0.9 % 250 mL (0.16 Units/mL) infusion (0 Units/min Intravenous Stopped 01/12/2017 0600)  norepinephrine (LEVOPHED) 4  mg in dextrose 5 % 250 mL (0.016 mg/mL) infusion (0 mcg/min Intravenous Duplicate 12/20/2016 0445)  aspirin suppository 300 mg (300 mg Rectal Not Given 12/23/2016 0445)  0.9 %  sodium chloride infusion ( Intravenous Duplicate 01/08/2017 0625)  sodium bicarbonate 150 mEq in sterile water 1,000 mL infusion ( Intravenous Not Given 12/18/2016 0600)  ipratropium-albuterol (DUONEB) 0.5-2.5 (3) MG/3ML nebulizer solution 3 mL (3 mLs Nebulization Given 12/27/2016 2313)  ipratropium-albuterol (DUONEB) 0.5-2.5 (3) MG/3ML nebulizer solution 3 mL (3 mLs Nebulization Given Jan 07, 2017 2313)  methylPREDNISolone sodium succinate (SOLU-MEDROL) 125 mg/2 mL injection 125 mg (125 mg Intravenous Given 01/06/2017 2313)  sodium chloride 0.9 % bolus 1,000 mL (0 mLs Intravenous Stopped 12/19/2016 0032)    And  sodium chloride 0.9 % bolus 1,000 mL (0 mLs Intravenous Stopped 12/28/2016 0048)    And  sodium chloride 0.9 % bolus 250 mL (0 mLs Intravenous Stopped 01/12/2017 0055)  levofloxacin (LEVAQUIN) IVPB 750 mg (0 mg Intravenous Stopped 01/11/2017 0132)  vancomycin (VANCOCIN) IVPB 1000 mg/200 mL premix (0 mg Intravenous Stopped 01/05/2017 0102)  aspirin chewable tablet 324 mg (324 mg Oral Given 12/23/2016 0018)  enoxaparin (LOVENOX) injection 75 mg (75 mg Subcutaneous Given 01/05/2017 0024)  sodium chloride 0.9 % bolus 500 mL (500 mLs Intravenous Given 12/17/2016 0230)  fentaNYL (SUBLIMAZE) injection 50 mcg (50 mcg Intravenous Given 01/08/2017 0245)  midazolam (VERSED) injection 2 mg (2 mg Intravenous Given 12/26/2016 0245)  rocuronium (ZEMURON) injection 50 mg (50 mg Intravenous Given 12/19/2016 0245)  fentaNYL (SUBLIMAZE) injection 50 mcg (50 mcg Intravenous Given 12/21/2016 0250)  sodium chloride 0.9 % bolus 500 mL (500 mLs Intravenous Given 12/17/2016 0300)     NEW OUTPATIENT MEDICATIONS STARTED DURING THIS VISIT:  Current Discharge Medication List      Current Discharge Medication List      Current Discharge Medication List       Note:  This document  was  prepared using Conservation officer, historic buildings and may include unintentional dictation errors.    Darci Current, MD Jan 22, 2017 [phone]

## 2017-01-06 NOTE — Telephone Encounter (Signed)
Called and left patient's wife a VM letting her know that a new antibiotic was sent in for her husband. I also let her know that Fleet Contras said to stop the first antibiotic if not finished already.

## 2017-01-06 NOTE — ED Notes (Signed)
Radiology at bedside

## 2017-01-07 ENCOUNTER — Inpatient Hospital Stay: Payer: Medicare Other

## 2017-01-07 ENCOUNTER — Other Ambulatory Visit: Payer: Self-pay | Admitting: Pulmonary Disease

## 2017-01-07 DIAGNOSIS — I214 Non-ST elevation (NSTEMI) myocardial infarction: Secondary | ICD-10-CM | POA: Diagnosis present

## 2017-01-07 DIAGNOSIS — N179 Acute kidney failure, unspecified: Secondary | ICD-10-CM | POA: Diagnosis present

## 2017-01-07 DIAGNOSIS — J9600 Acute respiratory failure, unspecified whether with hypoxia or hypercapnia: Secondary | ICD-10-CM | POA: Diagnosis present

## 2017-01-07 DIAGNOSIS — E785 Hyperlipidemia, unspecified: Secondary | ICD-10-CM | POA: Diagnosis present

## 2017-01-07 DIAGNOSIS — Z803 Family history of malignant neoplasm of breast: Secondary | ICD-10-CM | POA: Diagnosis not present

## 2017-01-07 DIAGNOSIS — Z66 Do not resuscitate: Secondary | ICD-10-CM | POA: Diagnosis present

## 2017-01-07 DIAGNOSIS — M79605 Pain in left leg: Secondary | ICD-10-CM

## 2017-01-07 DIAGNOSIS — J189 Pneumonia, unspecified organism: Secondary | ICD-10-CM | POA: Diagnosis present

## 2017-01-07 DIAGNOSIS — K219 Gastro-esophageal reflux disease without esophagitis: Secondary | ICD-10-CM | POA: Diagnosis present

## 2017-01-07 DIAGNOSIS — J9601 Acute respiratory failure with hypoxia: Secondary | ICD-10-CM

## 2017-01-07 DIAGNOSIS — J440 Chronic obstructive pulmonary disease with acute lower respiratory infection: Secondary | ICD-10-CM | POA: Diagnosis present

## 2017-01-07 DIAGNOSIS — Z8249 Family history of ischemic heart disease and other diseases of the circulatory system: Secondary | ICD-10-CM | POA: Diagnosis not present

## 2017-01-07 DIAGNOSIS — I472 Ventricular tachycardia: Secondary | ICD-10-CM | POA: Diagnosis present

## 2017-01-07 DIAGNOSIS — Z87891 Personal history of nicotine dependence: Secondary | ICD-10-CM

## 2017-01-07 DIAGNOSIS — Z7951 Long term (current) use of inhaled steroids: Secondary | ICD-10-CM | POA: Diagnosis not present

## 2017-01-07 DIAGNOSIS — G1221 Amyotrophic lateral sclerosis: Secondary | ICD-10-CM | POA: Diagnosis present

## 2017-01-07 DIAGNOSIS — J9620 Acute and chronic respiratory failure, unspecified whether with hypoxia or hypercapnia: Secondary | ICD-10-CM | POA: Diagnosis present

## 2017-01-07 DIAGNOSIS — G35 Multiple sclerosis: Secondary | ICD-10-CM | POA: Diagnosis present

## 2017-01-07 DIAGNOSIS — I462 Cardiac arrest due to underlying cardiac condition: Secondary | ICD-10-CM | POA: Diagnosis present

## 2017-01-07 DIAGNOSIS — M79604 Pain in right leg: Secondary | ICD-10-CM

## 2017-01-07 DIAGNOSIS — F17210 Nicotine dependence, cigarettes, uncomplicated: Secondary | ICD-10-CM | POA: Diagnosis present

## 2017-01-07 DIAGNOSIS — Z833 Family history of diabetes mellitus: Secondary | ICD-10-CM | POA: Diagnosis not present

## 2017-01-07 DIAGNOSIS — Z880 Allergy status to penicillin: Secondary | ICD-10-CM | POA: Diagnosis not present

## 2017-01-07 DIAGNOSIS — I70223 Atherosclerosis of native arteries of extremities with rest pain, bilateral legs: Secondary | ICD-10-CM

## 2017-01-07 DIAGNOSIS — Z79899 Other long term (current) drug therapy: Secondary | ICD-10-CM | POA: Diagnosis not present

## 2017-01-07 DIAGNOSIS — E872 Acidosis: Secondary | ICD-10-CM | POA: Diagnosis present

## 2017-01-07 DIAGNOSIS — Z801 Family history of malignant neoplasm of trachea, bronchus and lung: Secondary | ICD-10-CM | POA: Diagnosis not present

## 2017-01-07 DIAGNOSIS — Z9049 Acquired absence of other specified parts of digestive tract: Secondary | ICD-10-CM | POA: Diagnosis not present

## 2017-01-07 LAB — BLOOD GAS, ARTERIAL
Acid-base deficit: 12.8 mmol/L — ABNORMAL HIGH (ref 0.0–2.0)
BICARBONATE: 17.8 mmol/L — AB (ref 20.0–28.0)
FIO2: 1
MECHANICAL RATE: 16
MECHVT: 530 mL
O2 SAT: 99.7 %
PATIENT TEMPERATURE: 37
PEEP: 5 cmH2O
PO2 ART: 243 mmHg — AB (ref 83.0–108.0)
pCO2 arterial: 63 mmHg — ABNORMAL HIGH (ref 32.0–48.0)
pH, Arterial: 7.06 — CL (ref 7.350–7.450)

## 2017-01-07 LAB — BASIC METABOLIC PANEL
ANION GAP: 13 (ref 5–15)
ANION GAP: 12 (ref 5–15)
BUN: 21 mg/dL — ABNORMAL HIGH (ref 6–20)
BUN: 21 mg/dL — ABNORMAL HIGH (ref 6–20)
CALCIUM: 7.7 mg/dL — AB (ref 8.9–10.3)
CALCIUM: 6.3 mg/dL — AB (ref 8.9–10.3)
CO2: 21 mmol/L — ABNORMAL LOW (ref 22–32)
CO2: 21 mmol/L — AB (ref 22–32)
CREATININE: 1.54 mg/dL — AB (ref 0.61–1.24)
Chloride: 100 mmol/L — ABNORMAL LOW (ref 101–111)
Chloride: 103 mmol/L (ref 101–111)
Creatinine, Ser: 1.44 mg/dL — ABNORMAL HIGH (ref 0.61–1.24)
GFR calc Af Amer: 54 mL/min — ABNORMAL LOW (ref >60)
GFR, EST AFRICAN AMERICAN: 59 mL/min — AB (ref >60)
GFR, EST NON AFRICAN AMERICAN: 51 mL/min — AB (ref >60)
GFR, EST NON AFRICAN AMERICAN: 47 mL/min — AB (ref >60)
Glucose, Bld: 187 mg/dL — ABNORMAL HIGH (ref 65–99)
Glucose, Bld: 268 mg/dL — ABNORMAL HIGH (ref 65–99)
Potassium: 4.6 mmol/L (ref 3.5–5.1)
Potassium: 5.7 mmol/L — ABNORMAL HIGH (ref 3.5–5.1)
SODIUM: 134 mmol/L — AB (ref 135–145)
SODIUM: 136 mmol/L (ref 135–145)

## 2017-01-07 LAB — CBC
HCT: 38.2 % — ABNORMAL LOW (ref 40.0–52.0)
Hemoglobin: 12.7 g/dL — ABNORMAL LOW (ref 13.0–18.0)
MCH: 32.1 pg (ref 26.0–34.0)
MCHC: 33.3 g/dL (ref 32.0–36.0)
MCV: 96.3 fL (ref 80.0–100.0)
PLATELETS: 316 10*3/uL (ref 150–440)
RBC: 3.96 MIL/uL — ABNORMAL LOW (ref 4.40–5.90)
RDW: 14.4 % (ref 11.5–14.5)
WBC: 22.3 10*3/uL — AB (ref 3.8–10.6)

## 2017-01-07 LAB — TROPONIN I
TROPONIN I: 3.25 ng/mL — AB (ref <0.03)
TROPONIN I: 5.62 ng/mL — AB (ref <0.03)

## 2017-01-07 LAB — PHOSPHORUS: PHOSPHORUS: 4.6 mg/dL (ref 2.5–4.6)

## 2017-01-07 LAB — MAGNESIUM: MAGNESIUM: 2 mg/dL (ref 1.7–2.4)

## 2017-01-07 LAB — MRSA PCR SCREENING: MRSA by PCR: NEGATIVE

## 2017-01-07 LAB — LACTIC ACID, PLASMA
Lactic Acid, Venous: 1.9 mmol/L (ref 0.5–1.9)
Lactic Acid, Venous: 4 mmol/L (ref 0.5–1.9)

## 2017-01-07 LAB — GLUCOSE, CAPILLARY: Glucose-Capillary: 143 mg/dL — ABNORMAL HIGH (ref 65–99)

## 2017-01-07 LAB — PROCALCITONIN: Procalcitonin: <0.1 ng/mL

## 2017-01-07 MED ORDER — ONDANSETRON HCL 4 MG/2ML IJ SOLN: 4.0000 mg | Freq: Four times a day (QID) | INTRAMUSCULAR | Status: DC | PRN

## 2017-01-07 MED ORDER — METHYLPREDNISOLONE SODIUM SUCC 125 MG IJ SOLR
60.0000 mg | Freq: Four times a day (QID) | INTRAMUSCULAR | Status: DC
  Administered 2017-01-07: 60 mg via INTRAVENOUS
  Filled 2017-01-07: qty 2

## 2017-01-07 MED ORDER — METOPROLOL TARTRATE 5 MG/5ML IV SOLN
2.5000 mg | INTRAVENOUS | Status: DC | PRN
  Administered 2017-01-07: 5 mg via INTRAVENOUS

## 2017-01-07 MED ORDER — METOPROLOL TARTRATE 5 MG/5ML IV SOLN
INTRAVENOUS | Status: AC
  Administered 2017-01-07: 5 mg via INTRAVENOUS
  Filled 2017-01-07: qty 5

## 2017-01-07 MED ORDER — SODIUM CHLORIDE 0.9 % IV BOLUS (SEPSIS)
500.0000 mL | Freq: Once | INTRAVENOUS | Status: AC
  Administered 2017-01-07: 500 mL via INTRAVENOUS

## 2017-01-07 MED ORDER — METOPROLOL TARTRATE 25 MG PO TABS: 25.0000 mg | ORAL_TABLET | Freq: Two times a day (BID) | ORAL | Status: DC

## 2017-01-07 MED ORDER — AMIODARONE HCL IN DEXTROSE 360-4.14 MG/200ML-% IV SOLN
60.0000 mg/h | INTRAVENOUS | Status: AC
  Administered 2017-01-07: 60 mg/h via INTRAVENOUS

## 2017-01-07 MED ORDER — BACLOFEN 10 MG PO TABS: 20.0000 mg | ORAL_TABLET | Freq: Four times a day (QID) | ORAL | Status: DC

## 2017-01-07 MED ORDER — IPRATROPIUM-ALBUTEROL 0.5-2.5 (3) MG/3ML IN SOLN
3.0000 mL | RESPIRATORY_TRACT | Status: DC | PRN
  Administered 2017-01-07: 3 mL via RESPIRATORY_TRACT
  Filled 2017-01-07: qty 3

## 2017-01-07 MED ORDER — SODIUM CHLORIDE 0.9 % IV SOLN
INTRAVENOUS | Status: DC
  Administered 2017-01-07: 04:00:00 via INTRAVENOUS

## 2017-01-07 MED ORDER — ROCURONIUM BROMIDE 50 MG/5ML IV SOLN
INTRAVENOUS | Status: AC
  Administered 2017-01-07: 50 mg via INTRAVENOUS
  Filled 2017-01-07: qty 1

## 2017-01-07 MED ORDER — LORAZEPAM 2 MG/ML IJ SOLN
1.0000 mg | INTRAMUSCULAR | Status: DC | PRN
  Filled 2017-01-07: qty 1

## 2017-01-07 MED ORDER — ASPIRIN 81 MG PO CHEW: 81.0000 mg | CHEWABLE_TABLET | Freq: Every day | ORAL | Status: DC

## 2017-01-07 MED ORDER — ENOXAPARIN SODIUM 80 MG/0.8ML ~~LOC~~ SOLN
1.0000 mg/kg | Freq: Once | SUBCUTANEOUS | Status: AC
  Administered 2017-01-07: 75 mg via SUBCUTANEOUS

## 2017-01-07 MED ORDER — HEPARIN SODIUM (PORCINE) 5000 UNIT/ML IJ SOLN: 5000.0000 [IU] | Freq: Three times a day (TID) | INTRAMUSCULAR | Status: DC

## 2017-01-07 MED ORDER — PRAVASTATIN SODIUM 20 MG PO TABS: 20.0000 mg | ORAL_TABLET | Freq: Every day | ORAL | Status: DC

## 2017-01-07 MED ORDER — METHYLPREDNISOLONE SODIUM SUCC 40 MG IJ SOLR: 40.0000 mg | Freq: Two times a day (BID) | INTRAMUSCULAR | Status: DC

## 2017-01-07 MED ORDER — DOCUSATE SODIUM 50 MG/5ML PO LIQD: 100.0000 mg | Freq: Two times a day (BID) | ORAL | Status: DC | PRN

## 2017-01-07 MED ORDER — LEVOFLOXACIN IN D5W 750 MG/150ML IV SOLN: 750.0000 mg | INTRAVENOUS | Status: DC

## 2017-01-07 MED ORDER — ORAL CARE MOUTH RINSE: 15.0000 mL | Freq: Two times a day (BID) | OROMUCOSAL | Status: DC

## 2017-01-07 MED ORDER — ENOXAPARIN SODIUM 80 MG/0.8ML ~~LOC~~ SOLN: 1.0000 mg/kg | Freq: Two times a day (BID) | SUBCUTANEOUS | Status: DC

## 2017-01-07 MED ORDER — MIDAZOLAM HCL 2 MG/2ML IJ SOLN: 2.0000 mg | INTRAMUSCULAR | Status: DC | PRN

## 2017-01-07 MED ORDER — ROCURONIUM BROMIDE 50 MG/5ML IV SOLN
50.0000 mg | Freq: Once | INTRAVENOUS | Status: AC
  Administered 2017-01-07: 50 mg via INTRAVENOUS

## 2017-01-07 MED ORDER — SODIUM CHLORIDE 0.9% FLUSH: 10.0000 mL | INTRAVENOUS | Status: DC | PRN

## 2017-01-07 MED ORDER — MIDAZOLAM HCL 2 MG/2ML IJ SOLN
2.0000 mg | Freq: Once | INTRAMUSCULAR | Status: AC
  Administered 2017-01-07: 2 mg via INTRAVENOUS
  Filled 2017-01-07: qty 4

## 2017-01-07 MED ORDER — METOPROLOL TARTRATE 5 MG/5ML IV SOLN
2.5000 mg | INTRAVENOUS | Status: DC | PRN
  Administered 2017-01-07: 5 mg via INTRAVENOUS
  Filled 2017-01-07: qty 5

## 2017-01-07 MED ORDER — AMANTADINE HCL 100 MG PO CAPS
100.0000 mg | ORAL_CAPSULE | Freq: Two times a day (BID) | ORAL | Status: DC
  Filled 2017-01-07 (×2): qty 1

## 2017-01-07 MED ORDER — FENTANYL CITRATE (PF) 100 MCG/2ML IJ SOLN
50.0000 ug | Freq: Once | INTRAMUSCULAR | Status: AC
  Administered 2017-01-07: 50 ug via INTRAVENOUS

## 2017-01-07 MED ORDER — FENTANYL CITRATE (PF) 100 MCG/2ML IJ SOLN: 100.0000 ug | INTRAMUSCULAR | Status: DC | PRN

## 2017-01-07 MED ORDER — METOPROLOL TARTRATE 25 MG/10 ML ORAL SUSPENSION: 25.0000 mg | Freq: Two times a day (BID) | ORAL | Status: DC

## 2017-01-07 MED ORDER — STERILE WATER FOR INJECTION IV SOLN
INTRAVENOUS | Status: DC
  Filled 2017-01-07 (×2): qty 850

## 2017-01-07 MED ORDER — MORPHINE SULFATE (PF) 4 MG/ML IV SOLN
1.0000 mg | INTRAVENOUS | Status: DC | PRN
  Administered 2017-01-07: 1 mg via INTRAVENOUS
  Filled 2017-01-07: qty 1

## 2017-01-07 MED ORDER — FENTANYL CITRATE (PF) 100 MCG/2ML IJ SOLN
50.0000 ug | Freq: Once | INTRAMUSCULAR | Status: AC
  Administered 2017-01-07: 50 ug via INTRAVENOUS
  Filled 2017-01-07: qty 2

## 2017-01-07 MED ORDER — SODIUM CHLORIDE 0.9% FLUSH
10.0000 mL | Freq: Two times a day (BID) | INTRAVENOUS | Status: DC
  Administered 2017-01-07: 10 mL

## 2017-01-07 MED ORDER — VASOPRESSIN 20 UNIT/ML IV SOLN
0.0300 [IU]/min | INTRAVENOUS | Status: DC
  Administered 2017-01-07: 0.03 [IU]/min via INTRAVENOUS
  Filled 2017-01-07: qty 2

## 2017-01-07 MED ORDER — GABAPENTIN 300 MG PO CAPS: 300.0000 mg | ORAL_CAPSULE | Freq: Three times a day (TID) | ORAL | Status: DC

## 2017-01-07 MED ORDER — SODIUM CHLORIDE 0.9 % IV SOLN: INTRAVENOUS | Status: DC

## 2017-01-07 MED ORDER — SODIUM CHLORIDE 0.9 % IV SOLN: 250.0000 mL | INTRAVENOUS | Status: DC | PRN

## 2017-01-07 MED ORDER — CHLORHEXIDINE GLUCONATE 0.12 % MT SOLN: 15.0000 mL | Freq: Two times a day (BID) | OROMUCOSAL | Status: DC

## 2017-01-07 MED ORDER — DEXTROSE 5 % IV SOLN
0.0000 ug/min | INTRAVENOUS | Status: DC
  Administered 2017-01-07: 5 ug/min via INTRAVENOUS
  Filled 2017-01-07: qty 4

## 2017-01-07 MED ORDER — ENOXAPARIN SODIUM 100 MG/ML ~~LOC~~ SOLN
SUBCUTANEOUS | Status: AC
  Filled 2017-01-07: qty 1

## 2017-01-07 MED ORDER — IPRATROPIUM-ALBUTEROL 0.5-2.5 (3) MG/3ML IN SOLN: 3.0000 mL | Freq: Four times a day (QID) | RESPIRATORY_TRACT | Status: DC

## 2017-01-07 MED ORDER — ACETAMINOPHEN 325 MG PO TABS: 650.0000 mg | ORAL_TABLET | ORAL | Status: DC | PRN

## 2017-01-07 MED ORDER — AMIODARONE HCL IN DEXTROSE 360-4.14 MG/200ML-% IV SOLN: 30.0000 mg/h | INTRAVENOUS | Status: DC

## 2017-01-07 MED ORDER — PANTOPRAZOLE SODIUM 40 MG PO TBEC: 40.0000 mg | DELAYED_RELEASE_TABLET | Freq: Every day | ORAL | Status: DC

## 2017-01-07 MED ORDER — IPRATROPIUM-ALBUTEROL 0.5-2.5 (3) MG/3ML IN SOLN: 3.0000 mL | RESPIRATORY_TRACT | Status: DC | PRN

## 2017-01-07 MED ORDER — VENLAFAXINE HCL ER 75 MG PO CP24
150.0000 mg | ORAL_CAPSULE | Freq: Every day | ORAL | Status: DC
  Filled 2017-01-07: qty 1

## 2017-01-07 MED ORDER — ASPIRIN 300 MG RE SUPP: 300.0000 mg | RECTAL | Status: DC

## 2017-01-07 MED ORDER — VANCOMYCIN HCL IN DEXTROSE 1-5 GM/200ML-% IV SOLN
1000.0000 mg | INTRAVENOUS | Status: DC
  Filled 2017-01-07 (×2): qty 200

## 2017-01-07 MED ORDER — NOREPINEPHRINE BITARTRATE 1 MG/ML IV SOLN
0.0000 ug/min | INTRAVENOUS | Status: DC
  Filled 2017-01-07: qty 4

## 2017-01-07 MED ORDER — GABAPENTIN 300 MG PO CAPS
300.0000 mg | ORAL_CAPSULE | Freq: Three times a day (TID) | ORAL | Status: DC
Start: 2017-01-07 — End: 2017-01-07

## 2017-01-07 MED ORDER — FENTANYL CITRATE (PF) 100 MCG/2ML IJ SOLN
INTRAMUSCULAR | Status: AC
  Administered 2017-01-07: 50 ug via INTRAVENOUS
  Filled 2017-01-07: qty 2

## 2017-01-07 MED ORDER — ROCURONIUM BROMIDE 50 MG/5ML IV SOLN: 150.0000 mg | Freq: Once | INTRAVENOUS | Status: DC

## 2017-01-07 MED ORDER — MIDAZOLAM HCL 2 MG/2ML IJ SOLN
INTRAMUSCULAR | Status: AC
  Administered 2017-01-07: 2 mg via INTRAVENOUS
  Filled 2017-01-07: qty 2

## 2017-01-07 MED ORDER — BISACODYL 10 MG RE SUPP: 10.0000 mg | Freq: Every day | RECTAL | Status: DC | PRN

## 2017-01-07 MED ORDER — PANTOPRAZOLE SODIUM 40 MG IV SOLR: 40.0000 mg | INTRAVENOUS | Status: DC

## 2017-01-07 MED FILL — Medication: Qty: 1 | Status: AC

## 2017-01-08 ENCOUNTER — Telehealth: Payer: Self-pay

## 2017-01-08 LAB — HIV ANTIBODY (ROUTINE TESTING W REFLEX): HIV SCREEN 4TH GENERATION: NONREACTIVE

## 2017-01-08 NOTE — Telephone Encounter (Signed)
Received Death Certificate.  Placed in lbpu in box.  Please call when ready for pick up

## 2017-01-09 LAB — CULTURE, RESPIRATORY W GRAM STAIN: Culture: NORMAL

## 2017-01-09 LAB — BLOOD GAS, VENOUS
Acid-Base Excess: 4.1 mmol/L — ABNORMAL HIGH (ref 0.0–2.0)
Bicarbonate: 31.5 mmol/L — ABNORMAL HIGH (ref 20.0–28.0)
Patient temperature: 37
pCO2, Ven: 57 mmHg (ref 44.0–60.0)
pH, Ven: 7.35 (ref 7.250–7.430)

## 2017-01-09 LAB — CULTURE, RESPIRATORY

## 2017-01-11 ENCOUNTER — Telehealth: Payer: Self-pay

## 2017-01-11 NOTE — Telephone Encounter (Signed)
Death certificate received and placed in DK's folder to be signed.

## 2017-01-12 LAB — CULTURE, BLOOD (ROUTINE X 2)
CULTURE: NO GROWTH
Culture: NO GROWTH

## 2017-01-12 NOTE — Telephone Encounter (Signed)
Informed funeral home death certificate is ready to be picked up. Nothing further needed.

## 2017-01-14 NOTE — Progress Notes (Signed)
   Patient went into [facility] at around 03:45am.  Started ACLS prototocol. Shocked him  With  200 joules X3 times. Atropine X1, Amiodarone X1, Epinephrine x5, NaHCO3x1.  Patient had ROSC at around 4:05 am.  Family made aware of the patient's condition. We initiated hypothermia protocol on the patient.   Family has decided to make the patient DNR at this time.   Bincy Varughese,AG-ACNP Pulmonary & Critical Care

## 2017-01-14 NOTE — Progress Notes (Signed)
After further discussion with Wife and brother of patient, they have consented and agreed to DNR status as they stated that he would NOT want to be on life support.   Patient/Family are satisfied with Plan of action and management. All questions answered  Lucie Leather, M.D.  [facility]  Medical Director [facility] Medical Director [facility] Cardio-Pulmonary Department

## 2017-01-14 NOTE — Procedures (Signed)
Central Venous Catheter Placement: Indication: Patient receiving vesicant or irritant drug.; Patient receiving intravenous therapy for longer than 5 days.; Patient has limited or no vascular access.   Consent:emergent  Risks and benefits explained in detail including risk of infection, bleeding, respiratory failure and death..   Hand washing performed prior to starting the procedure.   Procedure: An active timeout was performed and correct patient, name, & ID confirmed.  After explaining risk and benefits, patient was positioned correctly for central venous access. Patient was prepped using strict sterile technique including chlorohexadine preps, sterile drape, sterile gown and sterile gloves.  The area was prepped, draped and anesthetized in the usual sterile manner. Patient comfort was obtained.  A triple lumen catheter was placed in RT Internal Jugular Vein There was good blood return, catheter caps were placed on lumens, catheter flushed easily, the line was secured and a sterile dressing and BIO-PATCH applied.   Ultrasound was used to visualize vasculature and guidance of needle.   Number of Attempts: 1 Complications:none Estimated Blood Loss: none Chest Radiograph indicated and ordered.  Operator: Shaun Turner/Shaun Turner   Shaun Turner, M.D.  [facility]  Medical Director [facility] Medical Director [facility] Cardio-Pulmonary Department

## 2017-01-14 NOTE — ED Notes (Signed)
MD Manson Passey made aware of pts critical lab value

## 2017-01-14 NOTE — ED Notes (Signed)
Admitting MD at bedside.

## 2017-01-14 NOTE — Progress Notes (Signed)
  Patient had increased work of breathing and noted to have expiratory wheezes.  Patient was told about artificial ventilation to improve work of breathing in the event of Respiratory Failure. Patient has clearly refused to be intubated or receive chest compressions in the event of Cardio-Respiratory arrest. Therefore code status was changed to DNR/DNI. Ordered steroids and bronchodilators at this time.   Dace Denn,AG-ACNP Pulmonary & Critical Care

## 2017-01-14 NOTE — ED Notes (Signed)
MD Manson Passey made aware of pts BP and HR. No neurological chgs in pt, pt still unhappy w/ BiPAP

## 2017-01-14 NOTE — Procedures (Signed)
Endotracheal Intubation: Patient required placement of an artificial airway secondary to resp failure.   Consent: Emergent.   Hand washing performed prior to starting the procedure.   Medications administered for sedation prior to procedure: Midazolam 2 mg IV,  Rocuronium 50 mg IV, Fentanyl 50 mcg IV.   Procedure: A time out procedure was called and correct patient, name, & ID confirmed. Needed supplies and equipment were assembled and checked to include ETT, 10 ml syringe, Glidescope, Mac and Miller blades, suction, oxygen and bag mask valve, end tidal CO2 monitor. Patient was positioned to align the mouth and pharynx to facilitate visualization of the glottis.  Heart rate, SpO2 and blood pressure was continuously monitored during the procedure. Pre-oxygenation was conducted prior to intubation and endotracheal tube was placed through the vocal cords into the trachea.  During intubation an assistant applied gentle pressure to the cricoid cartilage.   The artificial airway was placed under direct visualization via glidescope route using a  7.5ETT on the first attempt.    ETT was secured at 23 cm mark.    Placement was confirmed by auscuitation of lungs with good breath sounds bilaterally and no stomach sounds.  Condensation was noted on endotracheal tube.  Pulse ox 95%.  CO2 detector in place with appropriate color change.   Complications: None .   Operator: Frances Joynt/Varughese.   Chest radiograph ordered and pending.   Comments: OGT placed via glidescope.  Corrin Parker, M.D.  [facility]  Medical Director [place] Director [facility] Cardio-Pulmonary Department

## 2017-01-14 NOTE — Progress Notes (Signed)
ANTICOAGULATION CONSULT NOTE - Initial Consult  Pharmacy Consult for Lovenox dosing Indication: chest pain/ACS/NSTEMI  Allergies  Allergen Reactions  . Penicillins Rash    Patient Measurements: Height: 5\' 7"  (170.2 cm) Weight: 160 lb (72.6 kg) IBW/kg (Calculated) : 66.1 Enoxaparin Dosing Weight: 72.6 kg  Vital Signs: BP: 109/78 (02/22 0058) Pulse Rate: 140 (02/22 0058)  Labs:  Recent Labs  Jan 18, 2017 2305  HGB 12.4*  HCT 35.1*  PLT 322  CREATININE 1.52*  TROPONINI 3.83*    Estimated Creatinine Clearance: 47.7 mL/min (by C-G formula based on SCr of 1.52 mg/dL (H)).   Medical History: Past Medical History:  Diagnosis Date  . 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 Cervical radiculopathy   . COPD (chronic obstructive pulmonary disease) (HCC)   . Dyspepsia   . Hyperlipidemia   . MS (multiple sclerosis) (HCC)   . Tobacco abuse     Medications:    Assessment:  Goal of Therapy:  Monitor platelets by anticoagulation protocol: Yes   Plan:  Lovenox 1 mg/kg q 12 hours ordered. F/u labs per protocol.  Shaun Turner S 12/17/2016,1:19 AM

## 2017-01-14 NOTE — ED Notes (Addendum)
Pt agitated, took off BiPAP mask, wife same out of room and informed this RN.  Pts breathing labored upon entering room, stated "I had to take this thing off".  Pt let this RN place BiPAP mask back on after this RN quickly went through physiological consequences on oxygen deprivation.  Pt unhappy to have mask on, sts that mouth is dry. Informed pt that this RN was in process of calling report to CCU and pt would be transferred shortly after.  Pt alert and oriented x 4, denies neurological changes.

## 2017-01-14 NOTE — H&P (Signed)
PULMONARY / CRITICAL CARE MEDICINE   Name: Shaun Turner MRN: [id number] DOB: [birth year]    ADMISSION DATE:  01/09/2017 CONSULTATION DATE:  01/26/17  REFERRING MD:  Dr. Manson Passey  CHIEF COMPLAINT:  Respiratory Distress.  HISTORY OF PRESENT ILLNESS:   Shaun Turner is a 61 year old male with past medical history significant for multiple sclerosis, COPD, hyperlipidemia, former smoker, amyotrophic lateral sclerosis/progressive muscular atrophy. Patient presents to the ED via EMS with complaints of shortness of breath. Patient was satting 75% on room air. Patient was initially placed on NRB and switched to BiPAP for acute shortness of breath. CXR was concerning for RML/RLL pneumonia. Patient was started on antibiotics. His troponin was noted to be elevated-3.83 but did not have any ST elevation on EKG. PCCM team was called to admit the patient.  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  PAST SURGICAL HISTORY: He  has a past surgical history that includes Cholecystectomy.  Allergies  Allergen Reactions  . Penicillins Rash    No current facility-administered medications on file prior to encounter.    Current Outpatient Prescriptions on File Prior to Encounter  Medication Sig  . Albuterol Sulfate 108 (90 Base) MCG/ACT AEPB Inhale 2 puffs into the lungs every 4 (four) hours as needed.  Marland Kitchen amantadine (SYMMETREL) 100 MG capsule TAKE 1 CAPSULE (100 MG TOTAL) BY MOUTH 2 (TWO) TIMES DAILY.  Marland Kitchen azithromycin (ZITHROMAX) 250 MG tablet Take 2 tablets day one, then 1 tablet daily  . baclofen (LIORESAL) 20 MG tablet Take 20 mg by mouth 4 (four) times daily.  Marland Kitchen doxycycline (VIBRA-TABS) 100 MG tablet Take 1 tablet (100 mg total) by mouth 2 (two) times daily.  .  fluticasone-salmeterol (ADVAIR HFA) 230-21 MCG/ACT inhaler Inhale 2 puffs into the lungs 2 (two) times daily.  Marland Kitchen gabapentin (NEURONTIN) 300 MG capsule Take 300 mg by mouth 3 (three) times daily.  Marland Kitchen ipratropium (ATROVENT HFA) 17 MCG/ACT inhaler Inhale 1 puff into the lungs every 6 (six) hours as needed for wheezing.  . lovastatin (MEVACOR) 40 MG tablet Take 1.5 tablets (60 mg total) by mouth daily.  Marland Kitchen omeprazole (PRILOSEC) 40 MG capsule Take 1 capsule (40 mg total) by mouth daily.  . predniSONE (DELTASONE) 10 MG tablet 6 tabs daily x 3 days, then 5 tabs daily x 3 days etc  . traZODone (DESYREL) 100 MG tablet Take 4 tablets (400 mg total) by mouth at bedtime.  Marland Kitchen venlafaxine XR (EFFEXOR-XR) 150 MG 24 hr capsule Take 150 mg by mouth daily.    FAMILY HISTORY:  His indicated that his mother is alive. He indicated that his father is deceased. He indicated that both of his sisters are alive. He indicated that his brother is alive. He indicated that his maternal grandmother is deceased. He indicated that his maternal grandfather is deceased. He indicated that his paternal grandmother is deceased. He indicated that his paternal grandfather is deceased. He indicated that all of his four daughters are alive. He indicated that the status of his neg hx is unknown.    SOCIAL HISTORY: He  reports that he has been smoking Cigarettes.  He has a 40.00 pack-year smoking history. He has quit using smokeless tobacco. He reports that he does not drink alcohol or use drugs.  REVIEW OF SYSTEMS:   Review of Systems  Constitutional: Negative for chills,  diaphoresis, fever, malaise/fatigue and weight loss.  HENT: Negative for congestion, ear discharge, nosebleeds and sinus pain.   Eyes: Negative for photophobia and pain.  Respiratory: Positive for shortness of breath. Negative for sputum production and wheezing.   Cardiovascular: Negative for orthopnea, claudication and leg swelling.  Gastrointestinal: Negative for  abdominal pain, constipation and diarrhea.  Genitourinary: Negative for flank pain, frequency and hematuria.  Musculoskeletal: Negative for back pain and neck pain.  Neurological: Negative for sensory change, speech change, focal weakness and weakness.  Psychiatric/Behavioral: Negative for hallucinations and substance abuse. The patient is not nervous/anxious.      SUBJECTIVE:  Patient states that he was having shortness of breath but denies any fever, chills , chest pain, nausea and vomitting  VITAL SIGNS: BP 108/81   Pulse (!) 144   Resp (!) 29   Ht 5\' 7"  (1.702 m)   Wt 72.6 kg (160 lb)   SpO2 (!) 89%   BMI 25.06 kg/m   HEMODYNAMICS:    VENTILATOR SETTINGS:    INTAKE / OUTPUT: No intake/output data recorded.  PHYSICAL EXAMINATION: General:  Patient found on Bipap in some distress Neuro:  Awake, Alert, Oriented, no focal deficits HEENT:AT,Crane, no JVD Cardiovascular:  Tachycardic,no MRG noted Lungs:  Diminished right middle and lower lobe, no wheezes, crackles, rhonchi noted Abdomen: soft, nontender, active bowel sounds Musculoskeletal: active ROM Skin:  Warm, dry and Intact  LABS:  BMET  Recent Labs Lab 12/23/2016 2305  NA 134*  K 4.1  CL 94*  CO2 30  BUN 22*  CREATININE 1.52*  GLUCOSE 104*    Electrolytes  Recent Labs Lab 01/05/2017 2305  CALCIUM 8.7*    CBC  Recent Labs Lab 12/26/2016 2305  WBC 17.7*  HGB 12.4*  HCT 35.1*  PLT 322    Coag's No results for input(s): APTT, INR in the last 168 hours.  Sepsis Markers  Recent Labs Lab 01/12/2017 2348  LATICACIDVEN 1.9    ABG No results for input(s): PHART, PCO2ART, PO2ART in the last 168 hours.  Liver Enzymes No results for input(s): AST, ALT, ALKPHOS, BILITOT, ALBUMIN in the last 168 hours.  Cardiac Enzymes  Recent Labs Lab 12/30/2016 2305  TROPONINI 3.83*    Glucose No results for input(s): GLUCAP in the last 168 hours.  Imaging Dg Chest Port 1 View  Result Date:  12/19/2016 CLINICAL DATA:  61 year old male with respiratory distress.  COPD. EXAM: PORTABLE CHEST 1 VIEW COMPARISON:  Chest radiograph dated 08/04/2007 FINDINGS: There is emphysematous changes of the lungs with chronic interstitial coarsening. There is diffuse reticulonodular interstitial densities and mild bronchiectasis. No focal consolidation. There is no pleural effusion or pneumothorax. There is mild cardiomegaly. No acute osseous pathology. IMPRESSION: Emphysema with reticulonodular interstitial prominence primarily involving the right lung. No focal consolidation or pneumothorax. Findings may represent interstitial edema or atypical pneumonia. Clinical correlation is recommended. Electronically Signed   By: Elgie Collard M.D.   On: 12/28/2016 23:32     STUDIES:  None  CULTURES: 2/21 BC>> 2/21 [place]>>  ANTIBIOTICS: 2/21 Aztreonam x 1 2/21 levofloxacin>> 2/21 vancomycin>>  SIGNIFICANT EVENTS: 2/22 Patient admitted to the ICU with respiratory distress requiring BiPAP related to PNA  LINES/TUBES: None   ASSESSMENT / PLAN:  PULMONARY A: Acute Respiratory Failure related to RML/RLL PNA COPD Tobacco abuse- says"quit in January"  P:   Support with BiPAP, wean as tolerated Levo/vanc for antibiotics Bronchodilators PRN Methyl prednisone 125 mg X1  CARDIOVASCULAR A:  NSTEMI Elevated Troponins related to  demand Ischemia Hyperlipidemia Tachycardia P:  Continuous Telemetry Full Anticoagulation with Lovenox Trend troponins Metoprolol prn Trend troponins Continue Aspirin Continue Pravastatin Cardiology consulted RENAL A:   AKI possibly related to ATN from sepsis P:   Replace Electrolytes per usual guidelines Follow BMET  GASTROINTESTINAL A:   Hx of GERD P:   Continue Protonix  HEMATOLOGIC A:   No active issues P:  Transfuse per usual guidelines Getting anticoagulation  INFECTIOUS A:   Atypical PNA Leukocytosis P:   Antibiotics as above Monitor  fever, WBC Follow lactic acid and Procalcitonin Follow cultures Follow CBC  ENDOCRINE A:   No active issues P:   Blood Glucose checks intermittently with BMP  NEUROLOGIC A:   Hx of Multiple Sclerosis/Amyotrophic lateral sclerosis Hx of Progressive Muscular dystrophy P:   Continue Amantadine,baclofen Continue Venlafaxine  FAMILY  - Updates: No family present. Patient was updated regarding the plan of care.  All questions answered     Doneisha Ivey,AG-ACNP Pulmonary and Critical Care Medicine [facility]   12/29/2016, 12:47 AM

## 2017-01-14 NOTE — ED Notes (Signed)
Pt alert and oriented, no neurological changes.  Pt able to move all limbs, pt talking to this RN about TV show.  Wife at bedside

## 2017-01-14 NOTE — Progress Notes (Signed)
   12/28/2016 0600  Clinical Encounter Type  Visited With Family;Patient not available;Health care provider  Visit Type Initial;Psychological support;Spiritual support;Patient actively dying;Critical Care;Death  Referral From Nurse  Spiritual Encounters  Spiritual Needs Prayer;Emotional;Grief support  Stress Factors  Family Stress Factors Loss  CH responded to Code Blue; CH offered emotional and grief support along with prayer; CH was paged to be with family immediately preceding patient death; CH offered grief and emotional support to family at bedside.  6:21 AM Erline Levine

## 2017-01-14 NOTE — Discharge Summary (Signed)
   DEATH SUMMARY  Name: GREG NORENA MRN:   [id number] DOB:   1956/03/15           ADMISSION DATE:  01/05/2017 CONSULTATION DATE:  01-25-2017  REFERRING MD:  Dr. Manson Passey  CHIEF COMPLAINT:  Respiratory Distress.   Shaun Turner  was a 61 year old male with past medical history significant for multiple sclerosis, COPD, hyperlipidemia, former smoker, amyotrophic lateral sclerosis/progressive muscular atrophy. Patient presentED to the ED via EMS on 2/21 with complaints of shortness of breath. CXR was concerning for RML/RLL pneumonia. Patient was started on antibiotics. His troponin was noted to be elevated-3.83 but did not have any ST elevation on EKG.Patient was placed on BiPAP and send to the ICU. Patient was noted to be severely tachycardic and tachpnic.  Therefore was intubated for respiratory failure.  However shortly after intubation patient went into V-tach.  ACLS protocol was initiated and patient had return of spontaneous circulation 20 minutes after the PEA arrest.  .He was  shocked with  200 joules X3 times. Atropine X1, Amiodarone X1, Epinephrine x5, NaHCO3x1.  Patient had ROSC at around 4:05 am.  Family made aware of the patient's condition. We initiated hypothermia protocol on the patient.   Family had decided to make the patient DNR at that time and stated that" he would not  Want to be on life support."Patient was made a DNR.  Patient went into asystole and was pronounced dead at around 0600am on 2017/01/25. Wife, Daughters and Brother were present at the bedside.  Chaplain was also notified to provide supportive care to the grieving family .  Cause of Death Cardio Pulmonary arrest related to  NSTEMI related to severe demand Ischemia Acute on chronic respiratory failure Severe metabolic acidosis Lactic Acidosis Elevated Troponins COPD Respiratory insufficiency related to PNA.  Time of Death:0600 AM Date of Death:January 25, 2017    Bincy Varughese,AG-ACNP Pulmonary & Critical  Care

## 2017-01-14 NOTE — Progress Notes (Signed)
Pharmacy Antibiotic Note  Shaun Turner is a 61 y.o. male admitted on 12/22/2016 with sepsis.  Pharmacy has been consulted for vancomycin and Levaquin dosing.  Plan: DW 73kg  Vd 51L kei 0.044 hr-1  T1/2 16 hours Vancomycin 1 gram q 18 hours ordered with stacked dosing. Level before 5th dose. Goal trough 15-20.  Levaquin 750 mg q 48 hours ordered.   Height: 5\' 7"  (170.2 cm) Weight: 160 lb (72.6 kg) IBW/kg (Calculated) : 66.1  No data recorded.   Recent Labs Lab 12/28/2016 2305 01/05/2017 2348 January 16, 2017 0218  WBC 17.7*  --  22.3*  CREATININE 1.52*  --  1.44*  LATICACIDVEN  --  1.9 4.0*    Estimated Creatinine Clearance: 50.4 mL/min (by C-G formula based on SCr of 1.44 mg/dL (H)).    Allergies  Allergen Reactions  . Penicillins Rash    Antimicrobials this admission: vancomycin Levaquin 2/21 >>  Aztreonam x1  >>   Dose adjustments this admission:   Microbiology results: 2/21 BCx: pending 2/21 UCx: pending  2/22 Sputum: pending  2/22 MRSA PCR: (-)    2/21 CXR: edema vs. atypical pneumonia 2/21 UA: pending   Thank you for allowing pharmacy to be a part of this patient's care.  Niki Payment S 2017/01/16 3:47 AM

## 2017-01-14 DEATH — deceased

## 2017-01-19 ENCOUNTER — Ambulatory Visit: Payer: Medicare Other

## 2017-01-19 ENCOUNTER — Other Ambulatory Visit: Payer: Medicare Other

## 2017-01-19 ENCOUNTER — Ambulatory Visit: Payer: Medicare Other | Admitting: Oncology

## 2017-06-02 IMAGING — DX DG CHEST 1V PORT
1 series · 1 of 1 positions shown · non-contrast
Comparison: Chest radiograph 01/06/2017

CLINICAL DATA: Cardiac arrest

EXAM:
PORTABLE CHEST 1 VIEW

[chest ap]
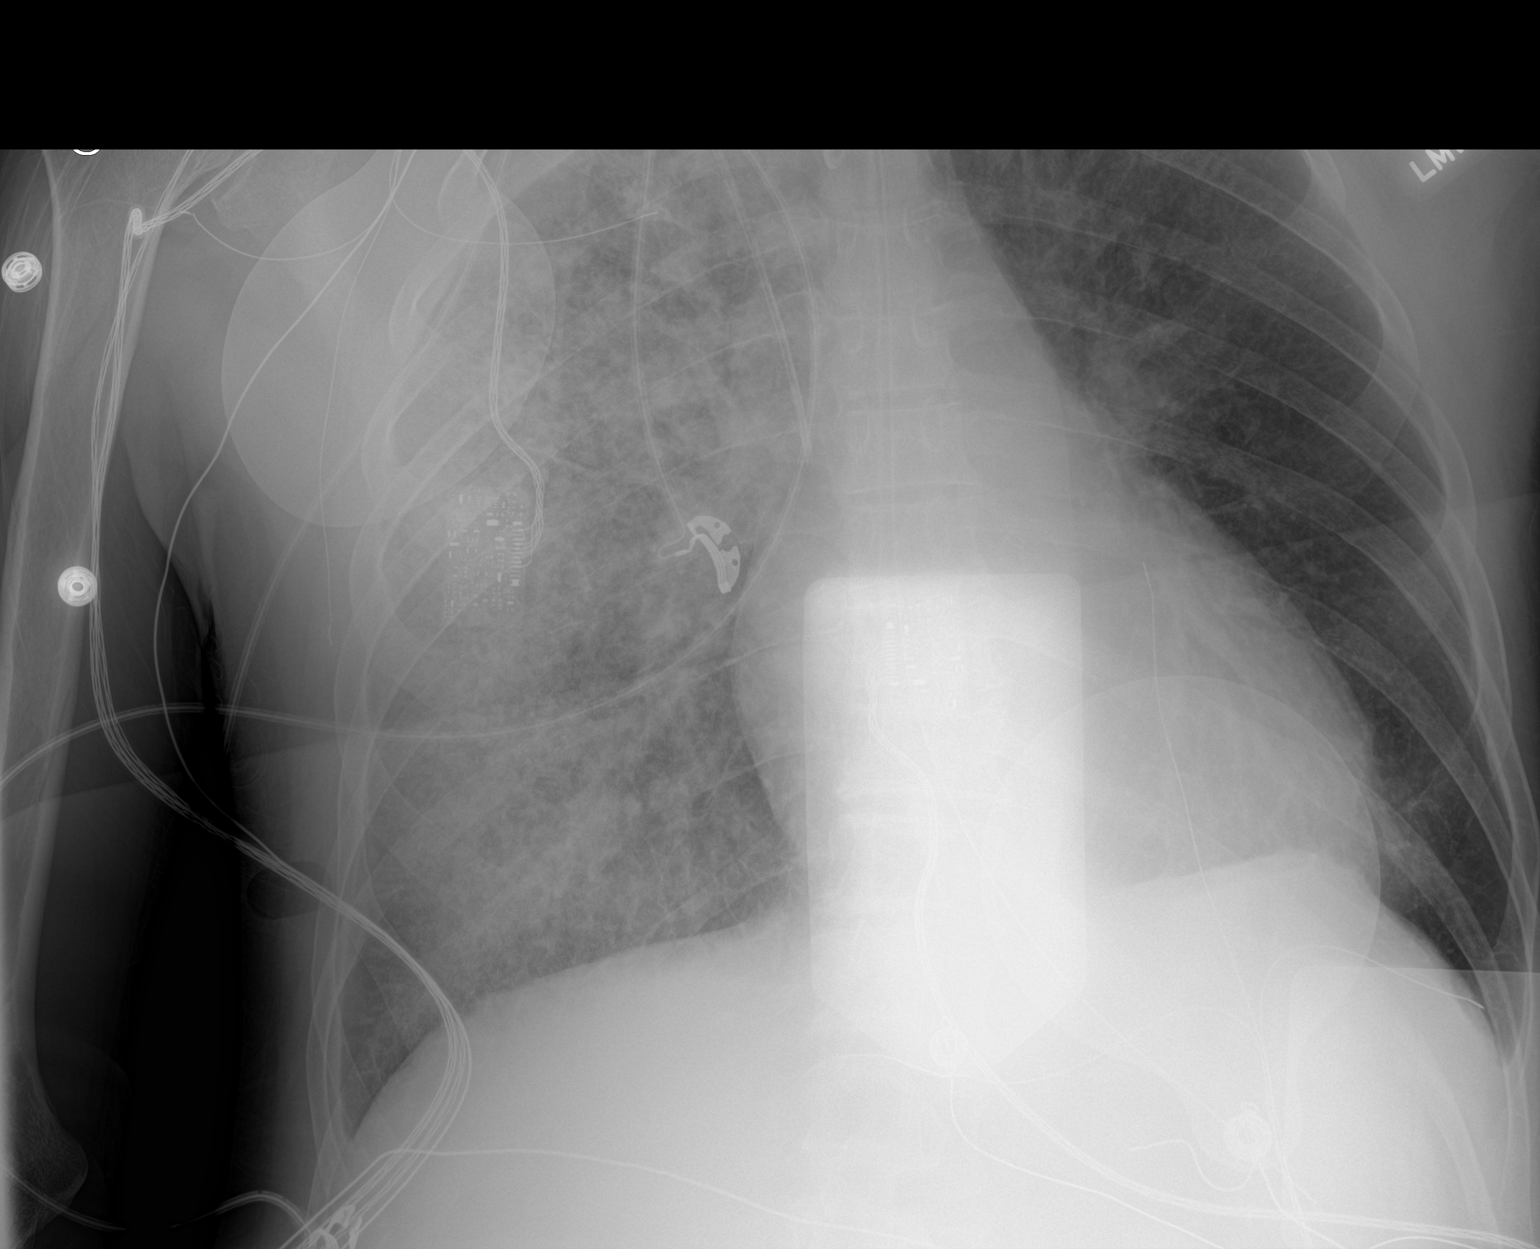

[1 of 1 positions shown; findings below may reference images not displayed]

FINDINGS: Endotracheal tube tip is just below the level of the clavicular
heads, in radiographically appropriate position. Nasogastric tube
courses below the diaphragm. The side port projects over the upper
stomach. Right internal jugular vein approach central venous
catheter tip overlies the lower SVC.

There are right greater than left airspace opacities. Cardiac
silhouette is unchanged.
IMPRESSION: 1. Endotracheal tube tip just below the clavicular heads, in
radiographically appropriate position.
2. Right IJ CVC tip at the lower SVC.
3. Worsening bilateral airspace opacities, particularly on the
right, likely pulmonary edema.
